# Patient Record
Sex: Female | Born: 1975
Health system: Southern US, Community
[De-identification: ages and names within clinical notes are randomized; demographics above are authoritative.]

## PROBLEM LIST (undated history)

## (undated) DIAGNOSIS — I10 Essential (primary) hypertension: Secondary | ICD-10-CM

## (undated) DIAGNOSIS — E119 Type 2 diabetes mellitus without complications: Secondary | ICD-10-CM

## (undated) DIAGNOSIS — E7800 Pure hypercholesterolemia, unspecified: Secondary | ICD-10-CM

## (undated) HISTORY — DX: Pure hypercholesterolemia, unspecified: E78.00

## (undated) HISTORY — DX: Essential (primary) hypertension: I10

## (undated) HISTORY — DX: Type 2 diabetes mellitus without complications: E11.9

---

## 1998-07-16 ENCOUNTER — Emergency Department (HOSPITAL_COMMUNITY): Admission: EM | Admit: 1998-07-16 | Discharge: 1998-07-16 | Payer: Self-pay | Admitting: Emergency Medicine

## 1999-11-04 ENCOUNTER — Other Ambulatory Visit: Admission: RE | Admit: 1999-11-04 | Discharge: 1999-11-04 | Payer: Self-pay | Admitting: Obstetrics and Gynecology

## 2000-01-26 ENCOUNTER — Emergency Department (HOSPITAL_COMMUNITY): Admission: EM | Admit: 2000-01-26 | Discharge: 2000-01-26 | Payer: Self-pay | Admitting: Emergency Medicine

## 2000-12-05 ENCOUNTER — Other Ambulatory Visit: Admission: RE | Admit: 2000-12-05 | Discharge: 2000-12-05 | Payer: Self-pay | Admitting: Obstetrics and Gynecology

## 2002-12-05 ENCOUNTER — Emergency Department (HOSPITAL_COMMUNITY): Admission: EM | Admit: 2002-12-05 | Discharge: 2002-12-05 | Payer: Self-pay | Admitting: Emergency Medicine

## 2003-09-20 ENCOUNTER — Emergency Department (HOSPITAL_COMMUNITY): Admission: EM | Admit: 2003-09-20 | Discharge: 2003-09-21 | Payer: Self-pay | Admitting: Emergency Medicine

## 2003-09-23 ENCOUNTER — Ambulatory Visit (HOSPITAL_COMMUNITY): Admission: RE | Admit: 2003-09-23 | Discharge: 2003-09-23 | Payer: Self-pay

## 2003-10-03 ENCOUNTER — Ambulatory Visit (HOSPITAL_COMMUNITY): Admission: RE | Admit: 2003-10-03 | Discharge: 2003-10-03 | Payer: Self-pay | Admitting: Neurology

## 2003-12-22 ENCOUNTER — Emergency Department (HOSPITAL_COMMUNITY): Admission: EM | Admit: 2003-12-22 | Discharge: 2003-12-22 | Payer: Self-pay | Admitting: Emergency Medicine

## 2004-03-22 ENCOUNTER — Ambulatory Visit (HOSPITAL_COMMUNITY): Admission: RE | Admit: 2004-03-22 | Discharge: 2004-03-22 | Payer: Self-pay | Admitting: Neurology

## 2004-06-16 ENCOUNTER — Encounter: Admission: RE | Admit: 2004-06-16 | Discharge: 2004-06-16 | Payer: Self-pay | Admitting: Neurology

## 2008-02-27 ENCOUNTER — Encounter: Admission: RE | Admit: 2008-02-27 | Discharge: 2008-02-27 | Payer: Self-pay | Admitting: Neurology

## 2016-01-13 ENCOUNTER — Other Ambulatory Visit: Payer: Self-pay | Admitting: Physician Assistant

## 2016-01-13 DIAGNOSIS — Z1231 Encounter for screening mammogram for malignant neoplasm of breast: Secondary | ICD-10-CM

## 2018-11-13 ENCOUNTER — Encounter: Payer: Self-pay | Admitting: Neurology

## 2018-12-05 NOTE — Progress Notes (Signed)
NEUROLOGY CONSULTATION NOTE  Melissa Collins MRN: [id number] DOB: 1975-02-22  Referring provider: Ralene Ok, PA-C Primary care provider: Ralene Ok, PA-C  Reason for consult:  Multiple sclerosis  HISTORY OF PRESENT ILLNESS: Melissa Collins is a 43 year old right-handed black female with hypertension who presents for multiple sclerosis.  History supplemented by referring provider note.  CT head from 09/21/2003 and MRIs of brain and cervical spine below are personally reviewed.  She was diagnosed with multiple sclerosis in 2005 after presenting with right hand and left foot numbness.  On 09/21/2003, she had a CT head without contrast that showed two white matter lesions in the right frontal lobe.  Follow up CT head with contrast demonstrated enhancement of one of the lesions.  She had an MRI of the brain and cervical spine with and without contrast on 09/24/03 which showed multiple subcortical white matter lesions in the cerebral hemispheres ranging from few millimeters up to 2 cm in diameter, several with abnormal enhancement.  No involvement of the brainstem, cerebellum and cervical spinal cord.  She did not have a lumbar puncture.  She was started on Betaseron from 2005 until early 2009.  It was discontinued due to fatigue and flu-like symptoms.  She has not been on any subsequent DMT.  She reports no significant MS flares.  Every now and then, she may feel lightheaded or she may note tingling in the leg or foot.  Vision:  In 2019 to early 2020, she had 3 episodes where she woke up in the morning and had complete vision loss for a couple of minutes in the right eye.  It happened three times about 3 to 4 months apart.  She didn't follow up with the eye doctor. No ocular pain.   Motor:  none Sensory:  none Pain:  none Gait:  none Bowel/Bladder:  none Fatigue:  none Cognition:  Good. Mood:  Good.  Current disease modifying therapy:  None Prior disease modifying therapy:  Betaseron (2005-2009;  flu-like symptoms, fatigue)  Imaging: 09/21/03 CT with and without contrast:  2 white matter lesions in right frontal lobe, one measuring 1 cm with enhancement. 09/24/03 MRI of brain and cervical spine with and without contrast: Multiple subcortical white matter lesions in the cerebral hemispheres ranging from few millimeters up to 2 cm in diameter, several with abnormal enhancement.  No involvement of the brainstem, cerebellum and cervical spinal cord. 03/23/2004 MRI of brain and cervical spine with and without contrast: 2 or 3 new lesions however previously noted lesions from 10/03/2003 have decreased in signal intensity and size plus less contrast enhancement. 06/17/04 MRI of brain with and without contrast: Multiple subcortical white matter lesions in the cerebral hemisphere, stable compared to prior imaging from 03/23/2004 as well as right frontal parietal developmental venous anomaly. 02/28/08 MRI of brain and cervical spine with and without contrast: Multiple subcortical white matter lesions in the bilateral cerebral hemispheres, improved from prior study on 06/17/2004.  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  No spinal cord involvement.  No family history of MS.  PAST MEDICAL HISTORY: Hypertension  PAST SURGICAL HISTORY: History reviewed. No pertinent surgical history.   MEDICATIONS: Outpatient Encounter Medications as of 12/08/2018  Medication Sig  . amLODipine (NORVASC) 5 MG tablet Take by mouth.  Marland Kitchen atorvastatin (LIPITOR) 20 MG tablet TAKE 1 TABLET BY MOUTH EVERY DAY   No facility-administered encounter medications on file as of 12/08/2018.     ALLERGIES: No Known Allergies  FAMILY HISTORY:  Family History  Problem Relation Age of Onset  . Diabetes Mother   . Hypertension Mother   . Diabetes Father   . Healthy Brother     SOCIAL HISTORY: Social History   Tobacco Use  . Smoking status: Never Smoker  . Smokeless tobacco: Never Used  Substance  Use Topics  . Alcohol use: Yes  . Drug use: Never    REVIEW OF SYSTEMS: Constitutional: No fevers, chills, or sweats, no generalized fatigue, change in appetite Eyes: No visual changes, double vision, eye pain Ear, nose and throat: No hearing loss, ear pain, nasal congestion, sore throat Cardiovascular: No chest pain, palpitations Respiratory:  No shortness of breath at rest or with exertion, wheezes GastrointestinaI: No nausea, vomiting, diarrhea, abdominal pain, fecal incontinence Genitourinary:  No dysuria, urinary retention or frequency Musculoskeletal:  No neck pain, back pain Integumentary: No rash, pruritus, skin lesions Neurological: as above Psychiatric: No depression, insomnia, anxiety Endocrine: No palpitations, fatigue, diaphoresis, mood swings, change in appetite, change in weight, increased thirst Hematologic/Lymphatic:  No purpura, petechiae. Allergic/Immunologic: no itchy/runny eyes, nasal congestion, recent allergic reactions, rashes  PHYSICAL EXAM: Blood pressure (!) 157/97, pulse 73, height 5\' 4"  (1.626 m), weight 268 lb 6.4 oz (121.7 kg), SpO2 100 %. General: No acute distress.  Patient appears well-groomed.   Head:  Normocephalic/atraumatic Eyes:  fundi examined but not visualized Neck: supple, no paraspinal tenderness, full range of motion Back: No paraspinal tenderness Heart: regular rate and rhythm Lungs: Clear to auscultation bilaterally. Vascular: No carotid bruits. Neurological Exam: Mental status: alert and oriented to person, place, and time, recent and remote memory intact, fund of knowledge intact, attention and concentration intact, speech fluent and not dysarthric, language intact. Cranial nerves: CN I: not tested CN II: pupils equal, round and reactive to light, visual fields intact CN III, IV, VI:  full range of motion, no nystagmus, no ptosis CN V: facial sensation intact CN VII: upper and lower face symmetric CN VIII: hearing intact CN IX,  X: gag intact, uvula midline CN XI: sternocleidomastoid and trapezius muscles intact CN XII: tongue midline Bulk & Tone: normal, no fasciculations. Motor:  5/5 throughout  Sensation:  Pinprick and vibration sensation intact.   Deep Tendon Reflexes:  2+ throughout, toes downgoing.   Finger to nose testing:  Without dysmetria.   Heel to shin:  Without dysmetria.   Gait:  Normal station and stride.  Able to turn and tandem walk. Romberg negative.  IMPRESSION: Multiple sclerosis Questionable amaurosis fugax in right eye.  Unclear if MS-related vs vascular.  PLAN: 1.  MRI of brain w/wo and MRA of head and neck 2.  Check CBC with diff, CMP and vitamin D 3.  Defers DMT for now.  If MRI shows disease progression, will have her follow up to start DMT.  Otherwise, follow up in one year. 4.  Will likely recommend starting ASA 81mg  daily. 5.  Otherwise, she will follow up in one year.  Thank you for allowing me to take part in the care of this patient.  Metta Clines, DO  CC: Junie Spencer, PA-C

## 2018-12-08 ENCOUNTER — Encounter: Payer: Self-pay | Admitting: Neurology

## 2018-12-08 ENCOUNTER — Other Ambulatory Visit: Payer: Self-pay

## 2018-12-08 ENCOUNTER — Ambulatory Visit (INDEPENDENT_AMBULATORY_CARE_PROVIDER_SITE_OTHER): Payer: 59 | Admitting: Neurology

## 2018-12-08 ENCOUNTER — Other Ambulatory Visit (INDEPENDENT_AMBULATORY_CARE_PROVIDER_SITE_OTHER): Payer: 59

## 2018-12-08 VITALS — BP 157/97 | HR 73 | Ht 64.0 in | Wt 268.4 lb

## 2018-12-08 DIAGNOSIS — G35 Multiple sclerosis: Secondary | ICD-10-CM | POA: Diagnosis not present

## 2018-12-08 DIAGNOSIS — G453 Amaurosis fugax: Secondary | ICD-10-CM

## 2018-12-08 DIAGNOSIS — G35D Multiple sclerosis, unspecified: Secondary | ICD-10-CM

## 2018-12-08 LAB — VITAMIN D 25 HYDROXY (VIT D DEFICIENCY, FRACTURES): VITD: 17.03 ng/mL — ABNORMAL LOW (ref 30.00–100.00)

## 2018-12-08 NOTE — Patient Instructions (Addendum)
1.  Check MRI of brain with and without contrast and MRA of head and neck 2.  Check CBC with diff, CMP, and vitamin D 3.   Follow up in one year but if MRI findings require change in management, I will have you make a follow up virtual appointment.  A referral to [facility] has been placed for your MRI someone will contact you directly to schedule your appt. They are located at [place]. Please contact them directly by calling [phone] with any questions regarding your referral.  Your provider has requested that you have labwork completed today. Please go to [facility] ([id number]) on the second floor of this building before leaving the office today. You do not need to check in. If you are not called within 15 minutes please check with the front desk.

## 2018-12-09 LAB — CBC WITH DIFFERENTIAL
Basophils Absolute: 0.1 10*3/uL (ref 0.0–0.2)
Basos: 1 %
EOS (ABSOLUTE): 0.2 10*3/uL (ref 0.0–0.4)
Eos: 3 %
Hematocrit: 39 % (ref 34.0–46.6)
Hemoglobin: 12.7 g/dL (ref 11.1–15.9)
Immature Grans (Abs): 0 10*3/uL (ref 0.0–0.1)
Immature Granulocytes: 0 %
Lymphocytes Absolute: 2 10*3/uL (ref 0.7–3.1)
Lymphs: 28 %
MCH: 26.6 pg (ref 26.6–33.0)
MCHC: 32.6 g/dL (ref 31.5–35.7)
MCV: 82 fL (ref 79–97)
Monocytes Absolute: 0.6 10*3/uL (ref 0.1–0.9)
Monocytes: 9 %
Neutrophils Absolute: 4.3 10*3/uL (ref 1.4–7.0)
Neutrophils: 59 %
RBC: 4.78 x10E6/uL (ref 3.77–5.28)
RDW: 14.3 % (ref 11.7–15.4)
WBC: 7.2 10*3/uL (ref 3.4–10.8)

## 2018-12-09 LAB — COMPLETE METABOLIC PANEL WITH GFR
AG Ratio: 1.2 (calc) (ref 1.0–2.5)
ALT: 14 U/L (ref 6–29)
AST: 13 U/L (ref 10–30)
Albumin: 4.2 g/dL (ref 3.6–5.1)
Alkaline phosphatase (APISO): 86 U/L (ref 31–125)
BUN: 14 mg/dL (ref 7–25)
CO2: 25 mmol/L (ref 20–32)
Calcium: 9.7 mg/dL (ref 8.6–10.2)
Chloride: 100 mmol/L (ref 98–110)
Creat: 0.84 mg/dL (ref 0.50–1.10)
GFR, Est African American: 100 mL/min/{1.73_m2} (ref >=60)
GFR, Est Non African American: 86 mL/min/{1.73_m2} (ref >=60)
Globulin: 3.4 g/dL (calc) (ref 1.9–3.7)
Glucose, Bld: 240 mg/dL — ABNORMAL HIGH (ref 65–99)
Potassium: 3.9 mmol/L (ref 3.5–5.3)
Sodium: 135 mmol/L (ref 135–146)
Total Bilirubin: 0.6 mg/dL (ref 0.2–1.2)
Total Protein: 7.6 g/dL (ref 6.1–8.1)

## 2019-05-01 ENCOUNTER — Telehealth: Payer: Self-pay | Admitting: Neurology

## 2019-05-01 NOTE — Telephone Encounter (Signed)
Pt states that about two weeks ago her tongue started going numb on th rt side. She states that her face on the rt side is numb and it is getting worse please call  He does not have any appts until July

## 2019-05-01 NOTE — Telephone Encounter (Signed)
Suggestions please? 

## 2019-05-01 NOTE — Telephone Encounter (Signed)
We can set her up for Solu-Medrol 1000mg  daily for 3 days.  [facility] is booking out a couple of weeks.  I would like to contact [facility] to see if they can set her up for an infusion in their center. Back in October, I had ordered MRI of brain with and without contrast as well as MRA of head and neck.  It doesn't seem to have been performed.  I would still like to have this performed (definitely the MRI of the brain ASAP). In the meantime, we can schedule her for 7:50 AM on Friday

## 2019-05-03 ENCOUNTER — Other Ambulatory Visit: Payer: Self-pay

## 2019-05-03 DIAGNOSIS — G453 Amaurosis fugax: Secondary | ICD-10-CM

## 2019-05-03 DIAGNOSIS — G35D Multiple sclerosis, unspecified: Secondary | ICD-10-CM

## 2019-05-03 DIAGNOSIS — G35 Multiple sclerosis: Secondary | ICD-10-CM

## 2019-05-03 NOTE — Telephone Encounter (Signed)
Can you schedule patient for the am and sent link back to me, thanks

## 2019-05-03 NOTE — Telephone Encounter (Signed)
I notified patient of time for 750 tomorrow, Mri is ordered, patient did not go to the previous appts. Hold off on infusion per Dr. Everlena Cooper for assessement 05/04/2019.

## 2019-05-03 NOTE — Progress Notes (Signed)
NEUROLOGY FOLLOW UP OFFICE NOTE  [name] [id number]  HISTORY OF PRESENT ILLNESS: Melissa Collins is a 44 year old right-handed black female with hypertension who follows up for multiple sclerosis.    UPDATE: On initial consult in October, I ordered MRI of brain with and without contrast to establish new baseline and evaluate for any disease progression since 2010.  If there was progression, decision was to start DMT.  I also ordered MRA of head and neck due to reported episodes of vision loss.  It appears that the MRI of brain was never ordered.  MRA of head and neck was ordered but never scheduled.   2 weeks ago, her tongue became numb and over the course of the next week gradually progressed to include the entire right side of her face and head with perioral tingling as well as tingling of her tongue and right side of her chin.  She cannot taste on the right side of her tongue.  No involvement of the neck, extremities or torso.  No weakness, dizziness, balance disorder or visual changes.  While she has had episodes of numbness in the past, nothing this severe.   It hasn't improved.  Current disease modifying therapy:  None  12/08/2018 LABS:  CBC with WBC 7.2, HGB 12.7, HCT 39, ALC 2.0; CMP with Na 135, K 3.9, Cl 100, CO2 25, Ca 9.7, glucose 240, BUN 14, Cr 0.84, t bili 0.6, ALP 86, AST 13, ALT 14; D 25-hydroxy 17.03.  She was advised to start D3 4000 IU daily.  HISTORY: She was diagnosed with multiple sclerosis in 2005 after presenting with right hand and left foot numbness.  On 09/21/2003, she had a CT head without contrast that showed two white matter lesions in the right frontal lobe.  Follow up CT head with contrast demonstrated enhancement of one of the lesions.  She had an MRI of the brain and cervical spine with and without contrast on 09/24/03 which showed multiple subcortical white matter lesions in the cerebral hemispheres ranging from few millimeters up to 2 cm in diameter, several with  abnormal enhancement.  No involvement of the brainstem, cerebellum and cervical spinal cord.  She did not have a lumbar puncture.  She was started on Betaseron from 2005 until early 2009.  It was discontinued due to fatigue and flu-like symptoms.  She has not been on any subsequent DMT.  She reports no significant MS flares.  Every now and then, she may feel lightheaded or she may note tingling in the leg or foot.  In 2019 to early 2020, she had 3 episodes where she woke up in the morning and had complete vision loss for a couple of minutes in the right eye.  It happened three times about 3 to 4 months apart.  She didn't follow up with the eye doctor. No ocular pain.     Prior disease modifying therapy:  Betaseron (2005-2009; flu-like symptoms, fatigue)  Imaging: 09/21/03 CT with and without contrast:  2 white matter lesions in right frontal lobe, one measuring 1 cm with enhancement. 09/24/03 MRI of brain and cervical spine with and without contrast: Multiple subcortical white matter lesions in the cerebral hemispheres ranging from few millimeters up to 2 cm in diameter, several with abnormal enhancement.  No involvement of the brainstem, cerebellum and cervical spinal cord. 03/23/2004 MRI of brain and cervical spine with and without contrast: 2 or 3 new lesions however previously noted lesions from 10/03/2003 have decreased in signal  intensity and size plus less contrast enhancement. 06/17/04 MRI of brain with and without contrast: Multiple subcortical white matter lesions in the cerebral hemisphere, stable compared to prior imaging from 03/23/2004 as well as right frontal parietal developmental venous anomaly. 02/28/08 MRI of brain and cervical spine with and without contrast: Multiple subcortical white matter lesions in the bilateral cerebral hemispheres, improved from prior study on 06/17/2004.  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  No spinal cord involvement.  No family  history of MS.  PAST MEDICAL HISTORY: Past Medical History:  Diagnosis Date  . High cholesterol   . Hypertension     MEDICATIONS: Current Outpatient Medications on File Prior to Visit  Medication Sig Dispense Refill  . amLODipine (NORVASC) 5 MG tablet Take by mouth.    Marland Kitchen atorvastatin (LIPITOR) 20 MG tablet TAKE 1 TABLET BY MOUTH EVERY DAY     No current facility-administered medications on file prior to visit.    ALLERGIES: No Known Allergies  FAMILY HISTORY: Family History  Problem Relation Age of Onset  . Diabetes Mother   . Hypertension Mother   . Diabetes Father   . Healthy Brother    SOCIAL HISTORY: Social History   Socioeconomic History  . Marital status: Single    Spouse name: Not on file  . Number of children: 1  . Years of education: Not on file  . Highest education level: Some college, no degree  Occupational History  . Not on file  Tobacco Use  . Smoking status: Never Smoker  . Smokeless tobacco: Never Used  Substance and Sexual Activity  . Alcohol use: Yes  . Drug use: Never  . Sexual activity: Not on file  Other Topics Concern  . Not on file  Social History Narrative  . Not on file   Social Determinants of Health   Financial Resource Strain:   . Difficulty of Paying Living Expenses:   Food Insecurity:   . Worried About Programme researcher, broadcasting/film/video in the Last Year:   . Barista in the Last Year:   Transportation Needs:   . Freight forwarder (Medical):   Marland Kitchen Lack of Transportation (Non-Medical):   Physical Activity:   . Days of Exercise per Week:   . Minutes of Exercise per Session:   Stress:   . Feeling of Stress :   Social Connections:   . Frequency of Communication with Friends and Family:   . Frequency of Social Gatherings with Friends and Family:   . Attends Religious Services:   . Active Member of Clubs or Organizations:   . Attends Banker Meetings:   Marland Kitchen Marital Status:   Intimate Partner Violence:   . Fear of  Current or Ex-Partner:   . Emotionally Abused:   Marland Kitchen Physically Abused:   . Sexually Abused:      PHYSICAL EXAM: Blood pressure (!) 139/95, pulse 86, resp. rate 18, height 5\' 4"  (1.626 m), weight 265 lb (120.2 kg), SpO2 98 %. General: No acute distress.  Patient appears well-groomed.   Head:  Normocephalic/atraumatic Eyes:  Fundi examined but not visualized Neck: supple, no paraspinal tenderness, full range of motion Heart:  Regular rate and rhythm Lungs:  Clear to auscultation bilaterally Back: No paraspinal tenderness Neurological Exam: alert and oriented to person, place, and time. Attention span and concentration intact, recent and remote memory intact, fund of knowledge intact.  Speech fluent and not dysarthric, language intact.  Decreased right V1-V3 sensory loss.  Otherwise, CN II-XII intact. Bulk and tone normal, muscle strength 5/5 throughout.  Sensation to pinprick and vibration intact.  Deep tendon reflexes 2+ throughout, toes downgoing.  Finger to nose and heel to shin testing intact.  Gait normal, able to turn and tandem walk.  Romberg negative.  IMPRESSION: 1.  Multiple sclerosis with exacerbation.  Typically I wouldn't treat a flare up for sensory deficits especially if ongoing for more than 2 weeks.  However, since she says symptoms have steadily gotten worse, it is reasonable.  However, I would like to avoid steroids due to diabetes.  Instead, we will try H.P. Acthar Gel.  She is now agreeable to starting a DMT. 2.  Episodic vision loss in right eye.  No recent events.  May be related to [place] but would like to evaluate for possible carotid artery stenosis  PLAN: 1.  We will order MRI of brain and cervical spine with and without contrast 2.  Due to symptoms of amaurosis fugax, will check bilateral carotid doppler 3.  For MS flare-up, H.P. Acthar Gel 80mg  daily for 2 weeks 4.  For DMT, start Vumerity 5.  D3 4000 IU daily 5.  Repeat CBC with diff, hepatic panel and vitamin D  level in 6 months (about a week prior to follow up)  Metta Clines, DO  CC: Junie Spencer, PA-C

## 2019-05-03 NOTE — Telephone Encounter (Signed)
Pt is already on the schedule for 05-04-19 @ 7:50

## 2019-05-04 ENCOUNTER — Ambulatory Visit: Payer: 59 | Admitting: Neurology

## 2019-05-04 ENCOUNTER — Encounter: Payer: Self-pay | Admitting: Neurology

## 2019-05-04 ENCOUNTER — Other Ambulatory Visit: Payer: Self-pay

## 2019-05-04 VITALS — BP 139/95 | HR 86 | Resp 18 | Ht 64.0 in | Wt 265.0 lb

## 2019-05-04 DIAGNOSIS — G35 Multiple sclerosis: Secondary | ICD-10-CM | POA: Diagnosis not present

## 2019-05-04 DIAGNOSIS — E119 Type 2 diabetes mellitus without complications: Secondary | ICD-10-CM | POA: Diagnosis not present

## 2019-05-04 DIAGNOSIS — G35D Multiple sclerosis, unspecified: Secondary | ICD-10-CM

## 2019-05-04 DIAGNOSIS — G453 Amaurosis fugax: Secondary | ICD-10-CM

## 2019-05-04 NOTE — Patient Instructions (Signed)
1.  We will get MRI of brain and cervical spine with and without contrast 2.  We will check carotid doppler 3.  For this MS flare up, I want to prescribe you an injection called H.P. Acthar Gel.   1 injection daily for 14 days. 4.  I want to start a daily MS medication called Vumerity.  We start 231mg  twice daily for 2 weeks, then increase to 462mg  twice daily 5.  Start vitamin D3 4000 IU daily 6.  Repeat CBC with diff, hepatic panel and vitamin D level in 6 months (about a week prior to follow up)

## 2019-05-08 ENCOUNTER — Other Ambulatory Visit: Payer: Self-pay | Admitting: Neurology

## 2019-05-08 ENCOUNTER — Telehealth: Payer: Self-pay | Admitting: Neurology

## 2019-05-08 MED ORDER — ACTHAR 80 UNIT/ML IJ GEL: 80.0000 [IU] | Freq: Every day | INTRAMUSCULAR | 0 refills | Status: DC

## 2019-05-08 MED ORDER — D3-1000 25 MCG (1000 UT) PO CAPS: 4000.0000 [IU] | ORAL_CAPSULE | Freq: Every day | ORAL | 5 refills | Status: DC

## 2019-05-08 NOTE — Progress Notes (Signed)
Sent script to [facility] for Acthar gel

## 2019-05-08 NOTE — Telephone Encounter (Signed)
I sent a script for the Acthar gel to [facility] as well, but may need some kind of prior authorization.

## 2019-05-08 NOTE — Telephone Encounter (Signed)
Patient called back to add on that her pharmacy does not have her vitamin D prescription either.

## 2019-05-08 NOTE — Telephone Encounter (Signed)
I [facility] the [facility]  The Vumerity and Acthar gel are specialty pharmacies.  The form for Vumerity was filled out last week.  We were going to contact the rep for Acthar gel to find out how to get this filled.

## 2019-05-08 NOTE — Telephone Encounter (Signed)
Patient called and requested a call back to confirm her medications. She said she thought she was to start two new medications for MS but the pharmacy does not have them.  1. "Vumerity"  2. "Acthar gel"  [facility] [address]

## 2019-05-08 NOTE — Telephone Encounter (Signed)
Pt called informed that [facility] and vit D3 and the Vumerity will come from specialty pharmacy ,

## 2019-05-09 ENCOUNTER — Encounter: Payer: Self-pay | Admitting: *Deleted

## 2019-05-09 NOTE — Progress Notes (Addendum)
While [employer] assists with medication procurement/payment, etc.  prior authorization from [employer] company is still required even if denied it must be documented that it is denied. Some insurance companies will require appeals to the denial before [employer] is allowed to help.   [facility] (Key: [id number]) Rx #: [id number] Vumerity 231MG  dr capsules   Form [facility] Electronic Prior Authorization Form (2017 NCPDP) Created 4 hours ago Sent to Plan 21 minutes ago Plan Response 21 minutes ago Submit Clinical Questions 5 minutes ago Determination Unfavorable less than a minute ago Message from Plan Request Reference Number: 12-05-2004. VUMERITY CAP 231MG  is denied for not meeting the prior authorization requirement(s). Details of this decision are in the notice attached below or have been faxed to you. Appeals are not supported through ePA. Please refer to the fax case notice for appeals information and instructions.

## 2019-05-09 NOTE — Progress Notes (Signed)
Fax received from [facility] sent to scan into her chart. 05/09/2019 Shon Millet [address] [place], [place] [address] Plan member ID: [id number] Case number: [id number] Prescriber name: Shon Millet Prescriber fax: [phone] NOTICE OF DENIAL Dear Arbutus Leas, On behalf of [employer], [facility] is responsible for reviewing pharmacy services provided to [facility] members. We received a request from your prescriber for coverage of Vumerity Cap 231mg . We reviewed all of the information you and/or your doctor sent to and sent the information to an appropriate physician specialist if needed. Unfortunately, we must deny coverage for Vumerity. Why was my request denied? This request was denied because you did not meet the following clinical requirements: The requested medication and/or diagnosis are not a covered benefit and excluded from coverage in accordance with the terms and conditions of your plan benefit. Therefore, the request has been administratively denied. The requested medication and/or diagnosis are not a covered benefit and are excluded from coverage in accordance with the terms and conditions of your plan benefit. Therefore, this request has been administratively denied. The reason(s) [facility] did not approve this medication can be found above. This denial is based on our Vumerity drug coverage policy, in addition to any supplementary information you or your prescriber may have submitted. How can I obtain the material(s) used to review this request? You may request, free of charge, a copy of the drug coverage policy, actual benefit provision, guideline, protocol or other information that factored into the decision, including the diagnosis code and the treatment code and their corresponding meanings, by calling us at [phone], or by writing to the address below: [facility] c/o Prior Authorization Guidelines [address] [place], [place] Please  note that this decision only affects whether your prescription plan will pay for this medication. Only you and your prescriber can decide what is best for you and your treatment. You may still buy this medication (at full cost) at your local pharmacy

## 2019-05-10 ENCOUNTER — Other Ambulatory Visit: Payer: Self-pay

## 2019-05-10 MED ORDER — ACTHAR 80 UNIT/ML IJ GEL: 80.0000 [IU] | Freq: Every day | INTRAMUSCULAR | 0 refills | Status: AC

## 2019-05-11 ENCOUNTER — Other Ambulatory Visit: Payer: Self-pay

## 2019-05-11 NOTE — Progress Notes (Signed)
Acthar Gel starter form faxed to [facility] at [phone], after faxing form sent to scan,

## 2019-05-14 DIAGNOSIS — Z0279 Encounter for issue of other medical certificate: Secondary | ICD-10-CM

## 2019-05-16 NOTE — Progress Notes (Signed)
PA started in [facility] my meds  Key [id number] CASE ID - [id number] Waiting on determination

## 2019-05-18 ENCOUNTER — Ambulatory Visit
Admission: RE | Admit: 2019-05-18 | Discharge: 2019-05-18 | Disposition: A | Discharge status: Home / Self Care | Payer: 59 | Source: Ambulatory Visit | Attending: Neurology | Admitting: Neurology

## 2019-05-18 DIAGNOSIS — G453 Amaurosis fugax: Secondary | ICD-10-CM

## 2019-05-18 DIAGNOSIS — G35 Multiple sclerosis: Secondary | ICD-10-CM

## 2019-05-21 ENCOUNTER — Telehealth: Payer: Self-pay

## 2019-05-21 ENCOUNTER — Other Ambulatory Visit: Payer: Self-pay

## 2019-05-21 DIAGNOSIS — G35 Multiple sclerosis: Secondary | ICD-10-CM

## 2019-05-21 DIAGNOSIS — G453 Amaurosis fugax: Secondary | ICD-10-CM

## 2019-05-21 NOTE — Progress Notes (Signed)
Pt informed of her CT results as well as Dr. Everlena Cooper adding CTA head and neck for GI [place].

## 2019-05-21 NOTE — Telephone Encounter (Signed)
-----   Message from Drema Dallas, DO sent at 05/20/2019  5:12 PM EDT ----- Carotid ultrasound shows no obvious findings but there is subtle findings suggesting possible blood vessel narrowing.  For better imaging, I would like to get CTA of head and neck for amaurosis fugax.

## 2019-05-21 NOTE — Telephone Encounter (Signed)
Tried calling pt, No answer Voice mail full. Will add order for CTA Head and neck per Dr. Everlena Cooper.

## 2019-05-22 ENCOUNTER — Telehealth: Payer: Self-pay | Admitting: Neurology

## 2019-05-22 NOTE — Telephone Encounter (Signed)
Yes, plan is to appeal the PA.  Patient is on the [employer] and should have already started Vumerity.

## 2019-05-22 NOTE — Telephone Encounter (Signed)
Patient's pharmacy called and wanted to know if Dr. Everlena Cooper is going to appeal the PA on Vamerity.

## 2019-05-23 NOTE — Telephone Encounter (Signed)
Called [employer] they confirmed medication was delivered on 05/16/2019. I confirmed we are working on appeal of denial received 05/09/2019  [id number]

## 2019-05-24 ENCOUNTER — Encounter: Payer: Self-pay | Admitting: Neurology

## 2019-05-24 ENCOUNTER — Encounter: Payer: Self-pay | Admitting: *Deleted

## 2019-05-24 NOTE — Progress Notes (Signed)
Fax sent to the plan Your PA has been faxed to the plan as a paper copy. Please contact the plan directly if you haven't received a determination in a typical timeframe.  You will be notified of the determination electronically and via fax. [place] (Key: [id number]) Vumerity 231MG  dr capsules   Form Form Appeal Created 15 days ago Sent to Plan 2 minutes ago Determination Wait for Determination Please wait for the payer to return a determination.  This form is used to appeal a previously denied prior authorization request.

## 2019-05-29 ENCOUNTER — Telehealth: Payer: Self-pay | Admitting: Neurology

## 2019-05-29 NOTE — Telephone Encounter (Signed)
Pt c/o numbness in her left arm x week and legs x 2 days. The numbness can radiate down her arm and legs. No pain in her legs or feet. Slight headache.    Pt also inquring about her appeal letter for Acthar. And discuss the MRI results.

## 2019-05-29 NOTE — Telephone Encounter (Signed)
Unfortunately there isn't any medication to treat numbness.  If she endorses paresthesias, such as tingling and pins and needles sensation, then we can prescribe gabapentin 100mg  three times daily.

## 2019-05-29 NOTE — Progress Notes (Addendum)
  Telephone call to pt insurance, Appeal in process have until 5/5 with determination.   Request Reference Number: [id number]. ACTHAR INJ [id number] is denied for not meeting the prior authorization requirement(s). Details of this decision are in the notice attached below or have been faxed to you.

## 2019-05-29 NOTE — Telephone Encounter (Signed)
Patient is calling about the paper work for the medication that we were working on. She would like the status of the paperwork and also she would like to speak to someone about her left leg and arm. Going numb   She works from 10:30 to 7:00

## 2019-05-29 NOTE — Progress Notes (Signed)
Acthar Gel Denied in cover mymeds, Dr Everlena Cooper given the appeal paper work

## 2019-05-29 NOTE — Telephone Encounter (Signed)
LMOVM for pt to call back  to discuss Dr. Everlena Cooper note

## 2019-05-31 ENCOUNTER — Telehealth: Payer: Self-pay | Admitting: Neurology

## 2019-05-31 NOTE — Telephone Encounter (Signed)
1. Patient called and left a message stating she wants to move forward with the new medication Dr. Everlena Cooper recommended for the tingling in her face and tongue.  2. Patient wants an update on her FMLA forms.

## 2019-06-01 NOTE — Telephone Encounter (Signed)
Telephone call to pt, Advised pt that the I left stated if pt is having any tingling in her feet and legs then we will send in Gabapentin. Also advised pt that we discussed on her last call pt has to do the Labs and MRI Dr. Adriana Mccallum ordered before he can fill out her FMLA forms.   Pt verbalized understanding.

## 2019-06-05 ENCOUNTER — Telehealth: Payer: Self-pay | Admitting: Neurology

## 2019-06-05 NOTE — Telephone Encounter (Signed)
LMOVM to call us back. Appeal already started for the medication

## 2019-06-05 NOTE — Progress Notes (Signed)
Received letter from [facility] dated May 25, 2019.  It has been determined that the medication is not eligible for benefits as requested. Full Letter sent to scan into her chart and faxed to Crystal with [employer]  Fax#[phone]

## 2019-06-05 NOTE — Telephone Encounter (Signed)
The following message was left with [employer] on 06/05/19 at 12:25 PM.  French Ana from [facility] wants to know if an appeal can be submitted for a medication that was declined by PA (Vumerity).  Pharmacy requesting a call back to discuss a possible appeal of patient's behalf.

## 2019-06-06 ENCOUNTER — Ambulatory Visit
Admission: RE | Admit: 2019-06-06 | Discharge: 2019-06-06 | Disposition: A | Discharge status: Home / Self Care | Payer: 59 | Source: Ambulatory Visit | Attending: Neurology | Admitting: Neurology

## 2019-06-06 ENCOUNTER — Other Ambulatory Visit: Payer: Self-pay

## 2019-06-06 DIAGNOSIS — G35D Multiple sclerosis, unspecified: Secondary | ICD-10-CM

## 2019-06-06 DIAGNOSIS — G453 Amaurosis fugax: Secondary | ICD-10-CM

## 2019-06-06 DIAGNOSIS — G35 Multiple sclerosis: Secondary | ICD-10-CM

## 2019-06-06 MED ORDER — IOHEXOL 300 MG/ML  SOLN
100.0000 mL | Freq: Once | INTRAMUSCULAR | Status: AC | PRN
  Administered 2019-06-06: 100 mL via INTRAVENOUS

## 2019-06-11 ENCOUNTER — Other Ambulatory Visit: Payer: Self-pay | Admitting: Neurology

## 2019-06-11 NOTE — Telephone Encounter (Signed)
Pharmacy notified meds pending

## 2019-06-11 NOTE — Telephone Encounter (Signed)
Left message with the after hour service on 06-08-19 @ 5:46 pm   Caller states she is a Child psychotherapist specialty pharmacy regardign a RX for patient please call back on Monday

## 2019-06-12 ENCOUNTER — Ambulatory Visit
Admission: RE | Admit: 2019-06-12 | Discharge: 2019-06-12 | Disposition: A | Discharge status: Home / Self Care | Payer: 59 | Source: Ambulatory Visit | Attending: Neurology | Admitting: Neurology

## 2019-06-12 DIAGNOSIS — G453 Amaurosis fugax: Secondary | ICD-10-CM

## 2019-06-12 DIAGNOSIS — G35 Multiple sclerosis: Secondary | ICD-10-CM

## 2019-06-12 MED ORDER — GADOBENATE DIMEGLUMINE 529 MG/ML IV SOLN
20.0000 mL | Freq: Once | INTRAVENOUS | Status: AC | PRN
  Administered 2019-06-12: 20 mL via INTRAVENOUS

## 2019-06-12 NOTE — Telephone Encounter (Signed)
Received our second denial notice for the last appeal we sent. This was forwarded to [employer] via fax. Crystal called and said she has all the information now to proceed with enrollment to receive Vumerity through their patient services. She will be reaching out to [facility] to set up shipment.

## 2019-06-13 ENCOUNTER — Other Ambulatory Visit: Payer: Self-pay

## 2019-06-13 DIAGNOSIS — G35 Multiple sclerosis: Secondary | ICD-10-CM

## 2019-06-13 NOTE — Progress Notes (Addendum)
Spoke to pt, Pt started Vumerity  2 weeks ago. Solu- Medrol 1000mg  x 3 days scheduled at [facility] 5/4-5/6.      LMOVM to call back as well as a detailed message of results of MRI and the next steps.

## 2019-06-13 NOTE — Progress Notes (Signed)
Orders for Labs added.   Melissa Collins or starla can you call and schedule this for the patient?   Per Darrol Jump calling pt 06/14/19. No answer. LMOVM

## 2019-06-14 ENCOUNTER — Telehealth: Payer: Self-pay

## 2019-06-14 NOTE — Telephone Encounter (Signed)
[facility] called back in this morning. Tech stated the appeal was still processing and she called the plan [employer]) and they did not see that any appeal was submitted to them. The plan's phone # is [phone]

## 2019-06-14 NOTE — Telephone Encounter (Signed)
Telephone call to pt insurance, Appeal in process have until 5/5 with determination.

## 2019-06-14 NOTE — Telephone Encounter (Signed)
Telephone  call to  Pt. FMLA forms filled and copy at the front desk for pt.

## 2019-06-19 ENCOUNTER — Encounter (HOSPITAL_COMMUNITY): Payer: Self-pay

## 2019-06-19 ENCOUNTER — Encounter (HOSPITAL_COMMUNITY)
Admission: RE | Admit: 2019-06-19 | Discharge: 2019-06-19 | Disposition: A | Discharge status: Home / Self Care | Payer: 59 | Source: Ambulatory Visit | Attending: Neurology | Admitting: Neurology

## 2019-06-19 ENCOUNTER — Other Ambulatory Visit: Payer: Self-pay

## 2019-06-19 DIAGNOSIS — G35 Multiple sclerosis: Secondary | ICD-10-CM | POA: Insufficient documentation

## 2019-06-19 MED ORDER — SODIUM CHLORIDE 0.9 % IV SOLN: Freq: Once | INTRAVENOUS | Status: AC

## 2019-06-19 MED ORDER — SODIUM CHLORIDE 0.9 % IV SOLN
1000.0000 mg | Freq: Every day | INTRAVENOUS | Status: DC
  Administered 2019-06-19: 11:00:00 1000 mg via INTRAVENOUS
  Filled 2019-06-19: qty 1000

## 2019-06-20 ENCOUNTER — Encounter (HOSPITAL_COMMUNITY): Payer: Self-pay

## 2019-06-20 ENCOUNTER — Encounter (HOSPITAL_COMMUNITY)
Admission: RE | Admit: 2019-06-20 | Discharge: 2019-06-20 | Disposition: A | Discharge status: Home / Self Care | Payer: 59 | Source: Ambulatory Visit | Attending: Neurology | Admitting: Neurology

## 2019-06-20 DIAGNOSIS — G35 Multiple sclerosis: Secondary | ICD-10-CM | POA: Diagnosis not present

## 2019-06-20 MED ORDER — SODIUM CHLORIDE 0.9 % IV SOLN
1000.0000 mg | Freq: Once | INTRAVENOUS | Status: AC
  Administered 2019-06-20: 1000 mg via INTRAVENOUS
  Filled 2019-06-20: qty 8

## 2019-06-20 MED ORDER — SODIUM CHLORIDE 0.9 % IV SOLN: Freq: Once | INTRAVENOUS | Status: AC

## 2019-06-21 ENCOUNTER — Other Ambulatory Visit: Payer: Self-pay

## 2019-06-21 ENCOUNTER — Telehealth: Payer: Self-pay | Admitting: *Deleted

## 2019-06-21 ENCOUNTER — Encounter (HOSPITAL_COMMUNITY)
Admission: RE | Admit: 2019-06-21 | Discharge: 2019-06-21 | Disposition: A | Discharge status: Home / Self Care | Payer: 59 | Source: Ambulatory Visit | Attending: Neurology | Admitting: Neurology

## 2019-06-21 ENCOUNTER — Encounter (HOSPITAL_COMMUNITY): Payer: Self-pay

## 2019-06-21 DIAGNOSIS — G35 Multiple sclerosis: Secondary | ICD-10-CM | POA: Diagnosis not present

## 2019-06-21 MED ORDER — SODIUM CHLORIDE 0.9 % IV SOLN
1000.0000 mg | Freq: Once | INTRAVENOUS | Status: AC
  Administered 2019-06-21: 1000 mg via INTRAVENOUS
  Filled 2019-06-21: qty 8

## 2019-06-21 MED ORDER — SODIUM CHLORIDE 0.9 % IV SOLN: Freq: Once | INTRAVENOUS | Status: AC

## 2019-06-21 NOTE — Telephone Encounter (Signed)
Can we contact Melissa Collins ASAP to see where she stands.  She needs to be started on disease modifying therapy immediately

## 2019-06-21 NOTE — Telephone Encounter (Signed)
Melissa Collins from [employer] support services left a voicemail yesterday evening asking if we are able to contact Melissa Collins to see if she is still interested in taking Vumerity for her MS. [employer] has reached out multiple times since receiving the last denial letter 06/05/2019 and Melissa Collins has been unresponsive. [employer] Support Services would like an update as to wether or not she needs help with this medication. [employer] [phone] M-F 9 am to 6 pm

## 2019-06-21 NOTE — Telephone Encounter (Signed)
Spoke with the pt, Pt spoke to the company 06/21/19.

## 2019-06-22 ENCOUNTER — Other Ambulatory Visit: Payer: 59

## 2019-06-26 ENCOUNTER — Other Ambulatory Visit: Payer: 59

## 2019-06-29 ENCOUNTER — Other Ambulatory Visit (INDEPENDENT_AMBULATORY_CARE_PROVIDER_SITE_OTHER): Payer: 59

## 2019-06-29 ENCOUNTER — Other Ambulatory Visit: Payer: Self-pay

## 2019-06-29 DIAGNOSIS — G35 Multiple sclerosis: Secondary | ICD-10-CM | POA: Diagnosis not present

## 2019-06-29 DIAGNOSIS — G453 Amaurosis fugax: Secondary | ICD-10-CM

## 2019-06-30 LAB — HEPATIC FUNCTION PANEL
AG Ratio: 1.4 (calc) (ref 1.0–2.5)
ALT: 9 U/L (ref 6–29)
AST: 9 U/L — ABNORMAL LOW (ref 10–30)
Albumin: 3.8 g/dL (ref 3.6–5.1)
Alkaline phosphatase (APISO): 71 U/L (ref 31–125)
Bilirubin, Direct: 0.1 mg/dL (ref 0.0–0.2)
Globulin: 2.8 g/dL (calc) (ref 1.9–3.7)
Indirect Bilirubin: 0.4 mg/dL (calc) (ref 0.2–1.2)
Total Bilirubin: 0.5 mg/dL (ref 0.2–1.2)
Total Protein: 6.6 g/dL (ref 6.1–8.1)

## 2019-06-30 LAB — CBC WITH DIFFERENTIAL
Basophils Absolute: 0.1 10*3/uL (ref 0.0–0.2)
Basos: 1 %
EOS (ABSOLUTE): 0.1 10*3/uL (ref 0.0–0.4)
Eos: 1 %
Hematocrit: 41.1 % (ref 34.0–46.6)
Hemoglobin: 13.1 g/dL (ref 11.1–15.9)
Immature Grans (Abs): 0.1 10*3/uL (ref 0.0–0.1)
Immature Granulocytes: 1 %
Lymphocytes Absolute: 2.1 10*3/uL (ref 0.7–3.1)
Lymphs: 22 %
MCH: 26.4 pg — ABNORMAL LOW (ref 26.6–33.0)
MCHC: 31.9 g/dL (ref 31.5–35.7)
MCV: 83 fL (ref 79–97)
Monocytes Absolute: 0.9 10*3/uL (ref 0.1–0.9)
Monocytes: 9 %
Neutrophils Absolute: 6.5 10*3/uL (ref 1.4–7.0)
Neutrophils: 66 %
RBC: 4.97 x10E6/uL (ref 3.77–5.28)
RDW: 15.4 % (ref 11.7–15.4)
WBC: 9.7 10*3/uL (ref 3.4–10.8)

## 2019-06-30 LAB — VITAMIN D 25 HYDROXY (VIT D DEFICIENCY, FRACTURES): Vit D, 25-Hydroxy: 36 ng/mL (ref 30–100)

## 2019-07-03 ENCOUNTER — Telehealth: Payer: Self-pay

## 2019-07-03 NOTE — Telephone Encounter (Signed)
FMLA: Need to make sure dates are correct on  form.

## 2019-07-17 NOTE — Telephone Encounter (Signed)
FMLA forms faxed over 07/04/19 and today 07/17/19 per pt to [employer]

## 2019-08-01 ENCOUNTER — Telehealth: Payer: Self-pay

## 2019-08-01 NOTE — Telephone Encounter (Signed)
Telephone call from [facility]: Needs provider to call to give directions and New order for Needles and Shringe they have in stock.

## 2019-08-02 NOTE — Telephone Encounter (Signed)
Script Clarified. Per Rep they only offer the 25g  5/8 needles  now.

## 2019-08-02 NOTE — Telephone Encounter (Signed)
It is not a medication that I typically prescribe, so we will need to contact the rep to find out how to specifically put in the order and what the recommended tapering schedule would be.

## 2019-08-15 ENCOUNTER — Telehealth: Payer: Self-pay

## 2019-08-15 NOTE — Telephone Encounter (Signed)
TP called in, Pt wanted to know if DR. Jaffe could change her occurrence of episodes from 1 times a month to 3 times a month with it lasting 3-5 days?   Please pt a call back to let her know if it could be changed. So she could send a new form.

## 2019-08-16 NOTE — Telephone Encounter (Signed)
Did she start the Vumerity?

## 2019-08-16 NOTE — Telephone Encounter (Signed)
I can't make that change to her FMLA

## 2019-08-16 NOTE — Telephone Encounter (Signed)
Yes per note scanned  in the chart,  pt medication was shipped 06/29/19.   Will call pt in regards to her FMLA.

## 2019-08-16 NOTE — Telephone Encounter (Signed)
Tried calling pt, No answer. VM FUll

## 2019-09-06 ENCOUNTER — Other Ambulatory Visit: Payer: Self-pay

## 2019-09-06 ENCOUNTER — Other Ambulatory Visit: Payer: Self-pay | Admitting: Neurology

## 2019-09-06 MED ORDER — "SAFETY SYRINGES/NEEDLE 21G X 1-1/2"" 5 ML MISC": 1.0000 mL | 0 refills | Status: AC

## 2019-09-06 NOTE — Telephone Encounter (Signed)
Yes, please send order.

## 2019-09-06 NOTE — Telephone Encounter (Signed)
Patient is supposed to give herself her last dose of Acthar, but they did not give her enough needles and syringes. She needs a prescription [facility] to [facility] on [address] for 2 needles and 1 syringe.

## 2019-09-06 NOTE — Telephone Encounter (Signed)
Orders added. Pt is aware

## 2019-09-06 NOTE — Telephone Encounter (Signed)
Pt needs 23G 5/8 needles and 23ml syringe sent to her pharmacy.  Per pt she spoke to [facility] and was advised to call us to see if that could be sent into her pharmacy since pt only needs it for tomorrow dose.

## 2019-11-01 NOTE — Progress Notes (Signed)
NEUROLOGY FOLLOW UP OFFICE NOTE  [name] [id number]  HISTORY OF PRESENT ILLNESS: Melissa Collins is a 27 year oldright-handed black female with hypertension who follows up for multiple sclerosis.  UPDATE: Current DMT:  Vumerity (been on for about a couple of months) Other medications:  D3 4000 IU   In March 2021, her tongue became numb and over the course of the next week gradually progressed to include the entire right side of her face and head with perioral tingling as well as tingling of her tongue and right side of her chin.  She cannot taste on the right side of her tongue.  No involvement of the neck, extremities or torso.  No weakness, dizziness, balance disorder or visual changes.  While she has had episodes of numbness in the past, nothing this severe.   It hasn't improved.  MRI of brain with and without contrast on 06/12/2019 showed new and active MS lesion in the right cerebellar peduncle, as well as new lesion in right centrum semiovale and a large lesion in the left periventricular white matter (grown since last imaging).  MRI of cervical spine with and without contrast showed no evidence of demyelinating disease.  Due to previous history suspicious for right amaurosis fugax, carotid doppler on 05/18/2019 was performed which no evidence of hemodynamically significant stenosis by duplex criteria but did demonstrate high resistance waveforms in the right ICA suggesting distal stenosis or intracranial stenosis.  She had a follow up CTA of head and neck on 06/06/2019 showed no significant intracranial or extracranial large vessel stenosis.  When last seen in March, she was prescribed H.P. Acthar Gel 80mg  daily for 2 weeks for acute flare up.  It was helpful.  It was decided to start Vumerity.  She feels woozy since starting the twice daily dosing.  She still feels off-balance.  She also reports  recurrence of intermittent right sided facial numbness (which resolved with the Acthar).     06/29/2019 LABS:  CBC with WBC 9.7, HGB 13.1, HCT 41.1, ALC 2.1; hepatic panel with t bili 0.5, ALP 71, AST 9, ALT 9; D 36  HISTORY: She was diagnosed with multiple sclerosis in 2005 after presenting with right hand and left foot numbness. On 09/21/2003, she had a CT head without contrast that showed two white matter lesions in the right frontal lobe. Follow up CT head with contrast demonstrated enhancement of one of the lesions. She had an MRI of the brain and cervical spine with and without contrast on 09/24/03 which showed multiple subcortical white matter lesions in the cerebral hemispheres ranging from few millimeters up to 2 cm in diameter, several with abnormal enhancement. No involvement of the brainstem, cerebellum and cervical spinal cord. She did not have a lumbar puncture. She was started on Betaseron from 2005 until early 2009. It was discontinued due to fatigue and flu-like symptoms. She has not been on any subsequent DMT. She reports no significant MS flares. Every now and then, she may feel lightheaded or she may note tingling in the leg or foot.  In 2019 to early 2020, she had 3 episodes where she woke up in the morning and had complete vision loss for a couple of minutes in the right eye. It happened three times about 3 to 4 months apart. She didn't follow up with the eye doctor. No ocular pain.   Prior disease modifying therapy:Betaseron (2005-2009; flu-like symptoms, fatigue)  Imaging: 09/21/03 CT with and without contrast: 2 white matter lesions in  right frontal lobe, one measuring 1 cm with enhancement. 09/24/03 MRIof brain and cervical spine with and without contrast: Multiple subcortical white matter lesions in the cerebral hemispheres ranging from few millimeters up to 2 cm in diameter, several with abnormal enhancement. No involvement of the brainstem, cerebellum and cervical spinal cord. 03/23/2004 MRI of brain and cervical spine with and without contrast: 2 or 3  new lesions however previously noted lesions from 10/03/2003 have decreased in signal intensity and size plus less contrast enhancement. 06/17/04 MRI of brain with and without contrast:Multiple subcortical white matter lesions in the cerebral hemisphere, stable compared to prior imaging from 03/23/2004 as well as right frontal parietal developmental venous anomaly. 02/28/08 MRIof brainand cervical spinewith and without contrast: Multiple subcortical white matter lesions in the bilateral cerebral hemispheres, improved from prior study on 06/17/2004. 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 No spinal cord involvement.  No family history of MS.  PAST MEDICAL HISTORY: Past Medical History:  Diagnosis Date  . Diabetes (HCC)   . High cholesterol   . Hypertension     MEDICATIONS: Current Outpatient Medications on File Prior to Visit  Medication Sig Dispense Refill  . amLODipine (NORVASC) 5 MG tablet Take by mouth.    Marland Kitchen atorvastatin (LIPITOR) 20 MG tablet TAKE 1 TABLET BY MOUTH EVERY DAY    . Cholecalciferol (D3-1000) 25 MCG (1000 UT) capsule Take 4 capsules (4,000 Units total) by mouth daily. 120 capsule 5  . corticotropin (ACTHAR) 80 UNIT/ML injectable gel Inject 1 mL (80 Units total) into the muscle daily. For 14 days. 1120 mL 0  . empagliflozin (JARDIANCE) 10 MG TABS tablet TAKE ONE TABLET (10 MG DOSE) BY MOUTH DAILY.    Marland Kitchen losartan-hydrochlorothiazide (HYZAAR) 100-25 MG tablet Take 1 tablet by mouth daily.    Letta Pate VERIO test strip 1 each daily.     No current facility-administered medications on file prior to visit.    ALLERGIES: Allergies  Allergen Reactions  . Metformin And Related Other (See Comments)    Fatigue     FAMILY HISTORY: Family History  Problem Relation Age of Onset  . Diabetes Mother   . Hypertension Mother   . Diabetes Father   . Healthy Brother     SOCIAL HISTORY: Social History   Socioeconomic History  . Marital  status: Single    Spouse name: Not on file  . Number of children: 1  . Years of education: Not on file  . Highest education level: Some college, no degree  Occupational History  . Not on file  Tobacco Use  . Smoking status: Current Some Day Smoker  . Smokeless tobacco: Never Used  Vaping Use  . Vaping Use: Never used  Substance and Sexual Activity  . Alcohol use: Yes  . Drug use: Never  . Sexual activity: Not on file  Other Topics Concern  . Not on file  Social History Narrative   Right handed   One story home   Drinks no caffeine   Social Determinants of Health   Financial Resource Strain:   . Difficulty of Paying Living Expenses: Not on file  Food Insecurity:   . Worried About Programme researcher, broadcasting/film/video in the Last Year: Not on file  . Ran Out of Food in the Last Year: Not on file  Transportation Needs:   . Lack of Transportation (Medical): Not on file  . Lack of Transportation (Non-Medical): Not on file  Physical Activity:   . Days of  Exercise per Week: Not on file  . Minutes of Exercise per Session: Not on file  Stress:   . Feeling of Stress : Not on file  Social Connections:   . Frequency of Communication with Friends and Family: Not on file  . Frequency of Social Gatherings with Friends and Family: Not on file  . Attends Religious Services: Not on file  . Active Member of Clubs or Organizations: Not on file  . Attends Banker Meetings: Not on file  . Marital Status: Not on file  Intimate Partner Violence:   . Fear of Current or Ex-Partner: Not on file  . Emotionally Abused: Not on file  . Physically Abused: Not on file  . Sexually Abused: Not on file    PHYSICAL EXAM: Blood pressure 132/84, pulse 70, height 5\' 4"  (1.626 m), weight 264 lb 9.6 oz (120 kg), SpO2 99 %. General: No acute distress.  Patient appears well-groomed.   Head:  Normocephalic/atraumatic Eyes:  Fundi examined but not visualized Neck: supple, no paraspinal tenderness, full range  of motion Heart:  Regular rate and rhythm Lungs:  Clear to auscultation bilaterally Back: No paraspinal tenderness Neurological Exam: alert and oriented to person, place, and time. Attention span and concentration intact, recent and remote memory intact, fund of knowledge intact.  Speech fluent and not dysarthric, language intact.  CN II-XII intact. Bulk and tone normal, muscle strength 5/5 throughout.  Sensation to pinprick and temperature and vibration intact.  Deep tendon reflexes 2+ throughout, toes downgoing.  Finger to nose and heel to shin testing intact.  Gait normal, Romberg negative.  IMPRESSION: Relapsing remitting multiple sclerosis  PLAN: 1.  Vumerity 2.  D3 4000 IU daily 3.  Check CBC with diff, CMP and vitamin D this week and again in 6 months. 4.  Repeat MRI of brain and cervical spine with and without contrast in 6 months 5.  Follow up in 6 months (after repeat testing)  , DO  CC: Shon Millet, PA-C

## 2019-11-05 ENCOUNTER — Encounter: Payer: Self-pay | Admitting: Neurology

## 2019-11-05 ENCOUNTER — Ambulatory Visit: Payer: 59 | Admitting: Neurology

## 2019-11-05 ENCOUNTER — Other Ambulatory Visit: Payer: Self-pay

## 2019-11-05 VITALS — BP 132/84 | HR 70 | Ht 64.0 in | Wt 264.6 lb

## 2019-11-05 DIAGNOSIS — G35 Multiple sclerosis: Secondary | ICD-10-CM | POA: Diagnosis not present

## 2019-11-05 NOTE — Patient Instructions (Signed)
1.  Continue Vumerity and D3 4000 IU daily 2.  Check CBC with diff, CMP and vitamin D level tomorrow and again in 6 months. 3.  Repeat MRI of brain and cervical spine with and without contrast in 6 months 4.  Follow up in 6 months (after repeat testing)

## 2019-11-09 ENCOUNTER — Other Ambulatory Visit (INDEPENDENT_AMBULATORY_CARE_PROVIDER_SITE_OTHER): Payer: 59

## 2019-11-09 ENCOUNTER — Other Ambulatory Visit: Payer: Self-pay

## 2019-11-09 DIAGNOSIS — G35 Multiple sclerosis: Secondary | ICD-10-CM

## 2019-11-09 LAB — COMPREHENSIVE METABOLIC PANEL
ALT: 9 U/L (ref 0–35)
AST: 9 U/L (ref 0–37)
Albumin: 4 g/dL (ref 3.5–5.2)
Alkaline Phosphatase: 63 U/L (ref 39–117)
BUN: 15 mg/dL (ref 6–23)
CO2: 28 mEq/L (ref 19–32)
Calcium: 9.4 mg/dL (ref 8.4–10.5)
Chloride: 98 mEq/L (ref 96–112)
Creatinine, Ser: 0.97 mg/dL (ref 0.40–1.20)
GFR: 76.22 mL/min (ref >60.00)
Glucose, Bld: 207 mg/dL — ABNORMAL HIGH (ref 70–99)
Potassium: 3.8 mEq/L (ref 3.5–5.1)
Sodium: 134 mEq/L — ABNORMAL LOW (ref 135–145)
Total Bilirubin: 0.4 mg/dL (ref 0.2–1.2)
Total Protein: 7.6 g/dL (ref 6.0–8.3)

## 2019-11-09 LAB — CBC WITH DIFFERENTIAL/PLATELET
Basophils Absolute: 0 10*3/uL (ref 0.0–0.1)
Basophils Relative: 0.7 % (ref 0.0–3.0)
Eosinophils Absolute: 0.3 10*3/uL (ref 0.0–0.7)
Eosinophils Relative: 3.9 % (ref 0.0–5.0)
HCT: 39.1 % (ref 36.0–46.0)
Hemoglobin: 12.7 g/dL (ref 12.0–15.0)
Lymphocytes Relative: 23.6 % (ref 12.0–46.0)
Lymphs Abs: 1.7 10*3/uL (ref 0.7–4.0)
MCHC: 32.4 g/dL (ref 30.0–36.0)
MCV: 82.3 fl (ref 78.0–100.0)
Monocytes Absolute: 0.8 10*3/uL (ref 0.1–1.0)
Monocytes Relative: 11.1 % (ref 3.0–12.0)
Neutro Abs: 4.5 10*3/uL (ref 1.4–7.7)
Neutrophils Relative %: 60.7 % (ref 43.0–77.0)
Platelets: 403 10*3/uL — ABNORMAL HIGH (ref 150.0–400.0)
RBC: 4.75 Mil/uL (ref 3.87–5.11)
RDW: 15.5 % (ref 11.5–15.5)
WBC: 7.4 10*3/uL (ref 4.0–10.5)

## 2019-11-09 LAB — VITAMIN D 25 HYDROXY (VIT D DEFICIENCY, FRACTURES): VITD: 34.3 ng/mL (ref 30.00–100.00)

## 2019-11-19 ENCOUNTER — Other Ambulatory Visit: Payer: Self-pay | Admitting: Neurology

## 2019-12-10 ENCOUNTER — Ambulatory Visit: Payer: 59 | Admitting: Neurology

## 2020-01-14 ENCOUNTER — Other Ambulatory Visit: Payer: Self-pay | Admitting: Neurology

## 2020-01-14 ENCOUNTER — Telehealth: Payer: Self-pay | Admitting: Neurology

## 2020-01-14 MED ORDER — VUMERITY 231 MG PO CPDR: 462.0000 mg | DELAYED_RELEASE_CAPSULE | Freq: Two times a day (BID) | ORAL | 5 refills | Status: DC

## 2020-01-14 NOTE — Telephone Encounter (Signed)
Done

## 2020-01-14 NOTE — Progress Notes (Unsigned)
vumerit

## 2020-03-31 ENCOUNTER — Other Ambulatory Visit: Payer: Self-pay

## 2020-03-31 ENCOUNTER — Telehealth: Payer: Self-pay | Admitting: Neurology

## 2020-03-31 MED ORDER — VUMERITY 231 MG PO CPDR: 462.0000 mg | DELAYED_RELEASE_CAPSULE | Freq: Two times a day (BID) | ORAL | 0 refills | Status: AC

## 2020-04-12 ENCOUNTER — Ambulatory Visit
Admission: RE | Admit: 2020-04-12 | Discharge: 2020-04-12 | Disposition: A | Discharge status: Home / Self Care | Payer: BLUE CROSS/BLUE SHIELD | Source: Ambulatory Visit | Attending: Neurology | Admitting: Neurology

## 2020-04-12 ENCOUNTER — Other Ambulatory Visit: Payer: Self-pay

## 2020-04-12 DIAGNOSIS — G35 Multiple sclerosis: Secondary | ICD-10-CM

## 2020-04-12 MED ORDER — GADOBENATE DIMEGLUMINE 529 MG/ML IV SOLN
20.0000 mL | Freq: Once | INTRAVENOUS | Status: AC | PRN
  Administered 2020-04-12: 20 mL via INTRAVENOUS

## 2020-05-13 NOTE — Progress Notes (Deleted)
NEUROLOGY FOLLOW UP OFFICE NOTE  Melissa Collins [id number]  Assessment/Plan:   Multiple sclerosis  1.  DMT:  Vumerity 2.  D3 4000 IU daily 3.  Check CBC with diff, CMP and vitamin D level today and repeat in 6 months. 4.  ***  Subjective:  Melissa Collins is a 64 year oldright-handed black female with hypertension who follows up for multiple sclerosis.  MRI of brain from last month personally reviewed.  UPDATE: Current DMT:  Vumerity (been on for about a couple of months) Other medications:  D3 4000 IU  04/12/2020 MRI BRAIN W WO:  No new or progressive lesions when compared to the study 06/12/2019.  Numerous foci of demyelination scattered throughout the brain are the same in number and size. No lesions show restricted diffusion or contrast enhancement. Previous enhancing lesion in the right middle cerebellar peduncle no longer enhances.  Vision:  *** Motor:  *** Sensory:  *** Pain:  *** Gait:  *** Bowel/Bladder:  *** Fatigue:  *** Cognition:  *** Mood:  ***   HISTORY: She was diagnosed with multiple sclerosis in 2005 after presenting with right hand and left foot numbness. On 09/21/2003, she had a CT head without contrast that showed two white matter lesions in the right frontal lobe. Follow up CT head with contrast demonstrated enhancement of one of the lesions. She had an MRI of the brain and cervical spine with and without contrast on 09/24/03 which showed multiple subcortical white matter lesions in the cerebral hemispheres ranging from few millimeters up to 2 cm in diameter, several with abnormal enhancement. No involvement of the brainstem, cerebellum and cervical spinal cord. She did not have a lumbar puncture. She was started on Betaseron from 2005 until early 2009. It was discontinued due to fatigue and flu-like symptoms. She has not been on any subsequent DMT. She reports no significant MS flares. Every now and then, she may feel lightheaded or she may note tingling  in the leg or foot.  In 2019 to early 2020, she had 3 episodes where she woke up in the morning and had complete vision loss for a couple of minutes in the right eye. It happened three times about 3 to 4 months apart. She didn't follow up with the eye doctor. No ocular pain.  Due to this previous history suspicious for right amaurosis fugax, carotid doppler on 05/18/2019 was performed which no evidence of hemodynamically significant stenosis by duplex criteria but did demonstrate high resistance waveforms in the right ICA suggesting distal stenosis or intracranial stenosis.  She had a follow up CTA of head and neck on 06/06/2019 showed no significant intracranial or extracranial large vessel stenosis.   In March 2021, her tongue became numb and over the course of the next week gradually progressed to include the entire right side of her face and head with perioral tingling as well as tingling of her tongue and right side of her chin. She cannot taste on the right side of her tongue. No involvement of the neck, extremities or torso. No weakness, dizziness, balance disorder or visual changes. While she has had episodes of numbness in the past, nothing this severe. MRI of brain showed active MS.  She was prescribed H.P. Acthar Gel 80mg  daily for 2 weeks for acute flare up which was helpful  Prior disease modifying therapy:Betaseron (2005-2009; flu-like symptoms, fatigue)  Imaging: 09/21/2003 CT HEAD W/WO: 2 white matter lesions in right frontal lobe, one measuring 1 cm with enhancement. 09/24/2003 MRIBRAIN &  CERVICAL SPINE W/WO: Multiple subcortical white matter lesions in the cerebral hemispheres ranging from few millimeters up to 2 cm in diameter, several with abnormal enhancement. No involvement of the brainstem, cerebellum and cervical spinal cord. 03/23/2004 MRI BRAIN & CERVICAL SPINE W/WO: 2 or 3 new lesions however previously noted lesions from 10/03/2003 have decreased in signal intensity  and size plus less contrast enhancement. 06/17/2004 MRI BRAIN W/WO:Multiple subcortical white matter lesions in the cerebral hemisphere, stable compared to prior imaging from 03/23/2004 as well as right frontal parietal developmental venous anomaly. 02/28/2008 MRIBRAIN & CERVICAL SPINE W/WO: Multiple subcortical white matter lesions in the bilateral cerebral hemispheres, improved from prior study on 06/17/2004. 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 No spinal cord involvement. 06/12/2019 MRI BRAIN W/WO: new and active MS lesion in the right cerebellar peduncle, as well as new lesion in right centrum semiovale and a large lesion in the left periventricular white matter (grown since last imaging).   06/12/2019 MRI CERVICAL SPINE W/WO:  no evidence of demyelinating disease.   No family history of MS.  PAST MEDICAL HISTORY: Past Medical History:  Diagnosis Date  . Diabetes (HCC)   . High cholesterol   . Hypertension     MEDICATIONS: Current Outpatient Medications on File Prior to Visit  Medication Sig Dispense Refill  . amLODipine (NORVASC) 5 MG tablet Take by mouth.    Marland Kitchen atorvastatin (LIPITOR) 20 MG tablet TAKE 1 TABLET BY MOUTH EVERY DAY    . corticotropin (ACTHAR) 80 UNIT/ML injectable gel Inject 1 mL (80 Units total) into the muscle daily. For 14 days. (Patient not taking: Reported on 11/05/2019) 1120 mL 0  . [facility] D3 25 MCG (1000 UT) capsule TAKE 4 CAPSULES (4,000 UNITS TOTAL) BY MOUTH DAILY. 120 capsule 5  . Diroximel Fumarate (VUMERITY) 231 MG CPDR Take 462 mg by mouth 2 (two) times daily. 120 capsule 0  . empagliflozin (JARDIANCE) 10 MG TABS tablet TAKE ONE TABLET (10 MG DOSE) BY MOUTH DAILY.    Marland Kitchen losartan-hydrochlorothiazide (HYZAAR) 100-25 MG tablet Take 1 tablet by mouth daily.    Letta Pate VERIO test strip 1 each daily.     No current facility-administered medications on file prior to visit.    ALLERGIES: Allergies  Allergen Reactions  .  Metformin And Related Other (See Comments)    Fatigue     FAMILY HISTORY: Family History  Problem Relation Age of Onset  . Diabetes Mother   . Hypertension Mother   . Diabetes Father   . Healthy Brother       Objective:  *** General: No acute distress.  Patient appears ***-groomed.   Head:  Normocephalic/atraumatic Eyes:  Fundi examined but not visualized Neck: supple, no paraspinal tenderness, full range of motion Heart:  Regular rate and rhythm Lungs:  Clear to auscultation bilaterally Back: No paraspinal tenderness Neurological Exam: alert and oriented to person, place, and time. Attention span and concentration intact, recent and remote memory intact, fund of knowledge intact.  Speech fluent and not dysarthric, language intact.  CN II-XII intact. Bulk and tone normal, muscle strength 5/5 throughout.  Sensation to light touch, temperature and vibration intact.  Deep tendon reflexes 2+ throughout, toes downgoing.  Finger to nose and heel to shin testing intact.  Gait normal, Romberg negative.     Shon Millet, DO  CC: Ralene Ok, PA-C

## 2020-05-14 ENCOUNTER — Telehealth: Payer: Self-pay

## 2020-05-14 ENCOUNTER — Ambulatory Visit: Payer: 59 | Admitting: Neurology

## 2020-05-14 NOTE — Telephone Encounter (Signed)
Tried calling pt, pt needs to have her 6 month labs done before Dr.Jaffe can send in a refill for her medication.

## 2020-08-21 NOTE — Progress Notes (Unsigned)
Prior authorization denied for vumerity. [facility] sent to scan. Advised patient NTBS per [facility] for Dr.Jaffe.

## 2020-11-06 NOTE — Progress Notes (Addendum)
NEUROLOGY FOLLOW UP OFFICE NOTE  Melissa Collins [id number]  Assessment/Plan:   Relapsing-remitting multiple sclerosis  DMT:  Vumerity - due not take with high-fat/high calorie (greater than 900 cal) meal D3 4000 IU Check CBC with diff, CMP, and vit D today and again in 6 months. Repeat MRI of brain with and without contrast in 6 months. Follow up 6 months.  Subjective:  Melissa Collins is a 45 year old right-handed black female with hypertension who follows up for multiple sclerosis.  MRI brain from February personally reviewed.   UPDATE: Current DMT:  Vumerity (since summer 2021) Not taking vitamin D3 because she was told that it is OTC.     04/12/2020 MRI brain with and without contrast:  No new or progressive lesions when compared to the study 06/12/2019.  Numerous foci of demyelination scattered throughout the brain are the same in number and size. No lesions show restricted diffusion or contrast enhancement. Previous enhancing lesion in the right middle cerebellar peduncle no longer enhances.  Still has residual numbness on the right side of the tongue but taste is now intact.   Last 2 digits right greater than left, sometimes toes off and on 24-48 hours   HISTORY: She was diagnosed with multiple sclerosis in 2005 after presenting with right hand and left foot numbness.  On 09/21/2003, she had a CT head without contrast that showed two white matter lesions in the right frontal lobe.  Follow up CT head with contrast demonstrated enhancement of one of the lesions.  She had an MRI of the brain and cervical spine with and without contrast on 09/24/03 which showed multiple subcortical white matter lesions in the cerebral hemispheres ranging from few millimeters up to 2 cm in diameter, several with abnormal enhancement.  No involvement of the brainstem, cerebellum and cervical spinal cord.  She did not have a lumbar puncture.  She was started on Betaseron from 2005 until early 2009.  It was  discontinued due to fatigue and flu-like symptoms.  She has not been on any subsequent DMT.  She reports no significant MS flares.  Every now and then, she may feel lightheaded or she may note tingling in the leg or foot.   In 2019 to early 2020, she had 3 episodes where she woke up in the morning and had complete vision loss for a couple of minutes in the right eye.  It happened three times about 3 to 4 months apart.  She didn't follow up with the eye doctor. No ocular pain.   carotid doppler on 05/18/2019 was performed which no evidence of hemodynamically significant stenosis by duplex criteria but did demonstrate high resistance waveforms in the right ICA suggesting distal stenosis or intracranial stenosis.  She had a follow up CTA of head and neck on 06/06/2019 showed no significant intracranial or extracranial large vessel stenosis.  In March 2021, she had an MS flare, described as right facial/cranial numbness and tingling, perioral tingling, tingling of tongue and loss of taste on right side of tongue.  She was treated with steroids but symptoms didn't improve.  MRI of brain with and without contrast on 06/12/2019 showed new and active MS lesion in the right cerebellar peduncle, as well as new lesion in right centrum semiovale and a large lesion in the left periventricular white matter (grown since last imaging).  She was then treated with H.P. Acthar Gel.     Prior disease modifying therapy:  Betaseron (2005-2009; flu-like symptoms, fatigue)  Imaging: 06/12/2019 MRI brain and cervical spine with and without contrast:  new and active MS lesion in the right cerebellar peduncle, as well as new lesion in right centrum semiovale and a large lesion in the left periventricular white matter (grown since last imaging.  Cervical spine with and without contrast showed no evidence of demyelinating disease 02/28/2008 MRI of brain and cervical spine with and without contrast:  Multiple subcortical white matter  lesions in the bilateral cerebral hemispheres, improved from prior study on 06/17/2004.  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  No spinal cord involvement. 06/17/2004 MRI of brain with and without contrast: Multiple subcortical white matter lesions in the cerebral hemisphere, stable compared to prior imaging from 03/23/2004 as well as right frontal parietal developmental venous anomaly. 03/23/2004 MRI of brain and cervical spine with and without contrast: 2 or 3 new lesions however previously noted lesions from 10/03/2003 have decreased in signal intensity and size plus less contrast enhancement. 09/24/2003 MRI of brain and cervical spine with and without contrast: Multiple subcortical white matter lesions in the cerebral hemispheres ranging from few millimeters up to 2 cm in diameter, several with abnormal enhancement.  No involvement of the brainstem, cerebellum and cervical spinal cord. 09/21/2003 CT with and without contrast:  2 white matter lesions in right frontal lobe, one measuring 1 cm with enhancement.  No family history of MS.  PAST MEDICAL HISTORY: Past Medical History:  Diagnosis Date   Diabetes (HCC)    High cholesterol    Hypertension     MEDICATIONS: Current Outpatient Medications on File Prior to Visit  Medication Sig Dispense Refill   amLODipine (NORVASC) 5 MG tablet Take by mouth.     atorvastatin (LIPITOR) 20 MG tablet TAKE 1 TABLET BY MOUTH EVERY DAY     corticotropin (ACTHAR) 80 UNIT/ML injectable gel Inject 1 mL (80 Units total) into the muscle daily. For 14 days. (Patient not taking: Reported on 11/05/2019) 1120 mL 0   [facility] D3 25 MCG (1000 UT) capsule TAKE 4 CAPSULES (4,000 UNITS TOTAL) BY MOUTH DAILY. 120 capsule 5   Diroximel Fumarate (VUMERITY) 231 MG CPDR Take 462 mg by mouth 2 (two) times daily. 120 capsule 0   empagliflozin (JARDIANCE) 10 MG TABS tablet TAKE ONE TABLET (10 MG DOSE) BY MOUTH DAILY.     losartan-hydrochlorothiazide  (HYZAAR) 100-25 MG tablet Take 1 tablet by mouth daily.     ONETOUCH VERIO test strip 1 each daily.     No current facility-administered medications on file prior to visit.    ALLERGIES: Allergies  Allergen Reactions   Metformin And Related Other (See Comments)    Fatigue     FAMILY HISTORY: Family History  Problem Relation Age of Onset   Diabetes Mother    Hypertension Mother    Diabetes Father    Healthy Brother       Objective:  Blood pressure 122/80, pulse 78, height 5\' 10"  (1.778 m), weight 264 lb (119.7 kg), SpO2 98 %. General: No acute distress.  Patient appears well-groomed.   Head:  Normocephalic/atraumatic Eyes:  Fundi examined but not visualized Neck: supple, no paraspinal tenderness, full range of motion Heart:  Regular rate and rhythm Lungs:  Clear to auscultation bilaterally Back: No paraspinal tenderness Neurological Exam: alert and oriented to person, place, and time.  Speech fluent and not dysarthric, language intact.  CN II-XII intact. Bulk and tone normal, muscle strength 5/5 throughout.  Sensation to light touch intact.  Deep tendon  reflexes 2+ throughout, toes downgoing.  Finger to nose testing intact.  Gait normal, Romberg negative.   Shon Millet, DO  CC: Ralene Ok, PA-C

## 2020-11-07 ENCOUNTER — Other Ambulatory Visit: Payer: BLUE CROSS/BLUE SHIELD

## 2020-11-07 ENCOUNTER — Ambulatory Visit: Payer: BLUE CROSS/BLUE SHIELD | Admitting: Neurology

## 2020-11-07 ENCOUNTER — Encounter: Payer: Self-pay | Admitting: Neurology

## 2020-11-07 ENCOUNTER — Other Ambulatory Visit: Payer: Self-pay

## 2020-11-07 VITALS — BP 122/80 | HR 78 | Ht 70.0 in | Wt 264.0 lb

## 2020-11-07 DIAGNOSIS — E119 Type 2 diabetes mellitus without complications: Secondary | ICD-10-CM

## 2020-11-07 DIAGNOSIS — G35 Multiple sclerosis: Secondary | ICD-10-CM | POA: Diagnosis not present

## 2020-11-07 LAB — CBC WITH DIFFERENTIAL/PLATELET
Basophils Absolute: 0.1 10*3/uL (ref 0.0–0.1)
Basophils Relative: 1.4 % (ref 0.0–3.0)
Eosinophils Absolute: 0.1 10*3/uL (ref 0.0–0.7)
Eosinophils Relative: 1.2 % (ref 0.0–5.0)
HCT: 37.2 % (ref 36.0–46.0)
Hemoglobin: 12.3 g/dL (ref 12.0–15.0)
Lymphocytes Relative: 26.4 % (ref 12.0–46.0)
Lymphs Abs: 1.8 10*3/uL (ref 0.7–4.0)
MCHC: 33 g/dL (ref 30.0–36.0)
MCV: 80.6 fl (ref 78.0–100.0)
Monocytes Absolute: 0.6 10*3/uL (ref 0.1–1.0)
Monocytes Relative: 9 % (ref 3.0–12.0)
Neutro Abs: 4.2 10*3/uL (ref 1.4–7.7)
Neutrophils Relative %: 62 % (ref 43.0–77.0)
Platelets: 391 10*3/uL (ref 150.0–400.0)
RBC: 4.61 Mil/uL (ref 3.87–5.11)
RDW: 15.7 % — ABNORMAL HIGH (ref 11.5–15.5)
WBC: 6.7 10*3/uL (ref 4.0–10.5)

## 2020-11-07 LAB — COMPREHENSIVE METABOLIC PANEL
ALT: 8 U/L (ref 0–35)
AST: 8 U/L (ref 0–37)
Albumin: 3.9 g/dL (ref 3.5–5.2)
Alkaline Phosphatase: 73 U/L (ref 39–117)
BUN: 10 mg/dL (ref 6–23)
CO2: 31 mEq/L (ref 19–32)
Calcium: 9.4 mg/dL (ref 8.4–10.5)
Chloride: 97 mEq/L (ref 96–112)
Creatinine, Ser: 0.91 mg/dL (ref 0.40–1.20)
GFR: 77.56 mL/min (ref >60.00)
Glucose, Bld: 288 mg/dL — ABNORMAL HIGH (ref 70–99)
Potassium: 4.1 mEq/L (ref 3.5–5.1)
Sodium: 134 mEq/L — ABNORMAL LOW (ref 135–145)
Total Bilirubin: 0.4 mg/dL (ref 0.2–1.2)
Total Protein: 7.5 g/dL (ref 6.0–8.3)

## 2020-11-07 LAB — VITAMIN B12: Vitamin B-12: 430 pg/mL (ref 211–911)

## 2020-11-07 LAB — VITAMIN D 25 HYDROXY (VIT D DEFICIENCY, FRACTURES): VITD: 25.71 ng/mL — ABNORMAL LOW (ref 30.00–100.00)

## 2020-11-07 NOTE — Patient Instructions (Signed)
Continue Vumerity.  Do not take with high-fat/high calorie meal (greater than 900 calories) Check CBC with diff, CMP, Vit D and B12.  Repeat CBC with diff, CMP and Vit D again in 6 months Repeat MRI of brain with and without contrast in 6 months Follow up in 6 months (after repeat testing)

## 2020-11-18 NOTE — Progress Notes (Signed)
Pt advised of her labs results, and recommendation to increase her Vitamin d3. Pt wanted to know if she needed that over the counter of Script. The 4000 Iu's was a script.  Please advise

## 2021-05-18 NOTE — Progress Notes (Deleted)
? ?NEUROLOGY FOLLOW UP OFFICE NOTE ? ?Melissa Collins ?[id number] ? ?Assessment/Plan:  ? ?Relapsing-remitting multiple sclerosis ?  ?DMT:  Vumerity - due not take with high-fat/high calorie (greater than 900 cal) meal ?D3 4000 IU ?Check CBC with diff, CMP, and vit D today and again in 6 months. ?Repeat MRI of brain with and without contrast in 6 months. ?Follow up 6 months. ?  ?Subjective:  ?Melissa Collins is a 46 year old right-handed black female with hypertension who follows up for multiple sclerosis.  MRI brain from February personally reviewed. ?  ?UPDATE: ?Current DMT:  Vumerity (since summer 2021) ?Not taking vitamin D3 because she was told that it is OTC.   ? ?11/07/2020 LABS:  CBC with WBC 6.7, HGB 12.3, HCT 37.2, PLT 391, ALC 1.8; CMP with Na 134, K 4.1, Cl 97, CO2 31, glucose 288, GFR 77.56, BUN 10, Cr 0.91, t bili 0.4, ALP 73, AST 8, ALT 8; Vit D 25.71; B12 430 ? ?MRI of brain was ordered last visit but not performed.  *** ?  ?Vision:  *** ?Motor:  *** ?Sensory:  *** ?Pain:  *** ?Gait:  *** ?Bowel/Bladder:  *** ?Fatigue:  *** ?Cognition:  *** ?Mood:  *** ? ?  ?Still has residual numbness on the right side of the tongue but taste is now intact.  ?  ?Last 2 digits right greater than left, sometimes toes off and on 24-48 hours ?  ?HISTORY: ?She was diagnosed with multiple sclerosis in 2005 after presenting with right hand and left foot numbness.  On 09/21/2003, she had a CT head without contrast that showed two white matter lesions in the right frontal lobe.  Follow up CT head with contrast demonstrated enhancement of one of the lesions.  She had an MRI of the brain and cervical spine with and without contrast on 09/24/03 which showed multiple subcortical white matter lesions in the cerebral hemispheres ranging from few millimeters up to 2 cm in diameter, several with abnormal enhancement.  No involvement of the brainstem, cerebellum and cervical spinal cord.  She did not have a lumbar puncture.  She was started on  Betaseron from 2005 until early 2009.  It was discontinued due to fatigue and flu-like symptoms.  She has not been on any subsequent DMT.  She reports no significant MS flares.  Every now and then, she may feel lightheaded or she may note tingling in the leg or foot. ?  ?In 2019 to early 2020, she had 3 episodes where she woke up in the morning and had complete vision loss for a couple of minutes in the right eye.  It happened three times about 3 to 4 months apart.  She didn't follow up with the eye doctor. No ocular pain.   carotid doppler on 05/18/2019 was performed which no evidence of hemodynamically significant stenosis by duplex criteria but did demonstrate high resistance waveforms in the right ICA suggesting distal stenosis or intracranial stenosis.  She had a follow up CTA of head and neck on 06/06/2019 showed no significant intracranial or extracranial large vessel stenosis. ?  ?In March 2021, she had an MS flare, described as right facial/cranial numbness and tingling, perioral tingling, tingling of tongue and loss of taste on right side of tongue.  She was treated with steroids but symptoms didn't improve.  MRI of brain with and without contrast on 06/12/2019 showed new and active MS lesion in the right cerebellar peduncle, as well as new lesion in right centrum semiovale  and a large lesion in the left periventricular white matter (grown since last imaging).  She was then treated with H.P. Acthar Gel.   ?  ?Prior disease modifying therapy:  Betaseron (2005-2009; flu-like symptoms, fatigue) ?  ?Imaging: ?04/12/2020 MRI brain with and without contrast:  No new or progressive lesions when compared to the study 06/12/2019.  Numerous foci of demyelination scattered throughout the brain are the same in number and size. No lesions show restricted diffusion or contrast enhancement. Previous enhancing lesion in the right middle cerebellar peduncle no longer enhances. ?06/12/2019 MRI brain and cervical spine with and  without contrast:  new and active MS lesion in the right cerebellar peduncle, as well as new lesion in right centrum semiovale and a large lesion in the left periventricular white matter (grown since last imaging.  Cervical spine with and without contrast showed no evidence of demyelinating disease ?02/28/2008 MRI of brain and cervical spine with and without contrast:  Multiple subcortical white matter lesions in the bilateral cerebral hemispheres, improved from prior study on 06/17/2004.  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  No spinal cord involvement. ?06/17/2004 MRI of brain with and without contrast: Multiple subcortical white matter lesions in the cerebral hemisphere, stable compared to prior imaging from 03/23/2004 as well as right frontal parietal developmental venous anomaly. ?03/23/2004 MRI of brain and cervical spine with and without contrast: 2 or 3 new lesions however previously noted lesions from 10/03/2003 have decreased in signal intensity and size plus less contrast enhancement. ?09/24/2003 MRI of brain and cervical spine with and without contrast: Multiple subcortical white matter lesions in the cerebral hemispheres ranging from few millimeters up to 2 cm in diameter, several with abnormal enhancement.  No involvement of the brainstem, cerebellum and cervical spinal cord. ?09/21/2003 CT with and without contrast:  2 white matter lesions in right frontal lobe, one measuring 1 cm with enhancement. ?  ?No family history of MS. ? ?PAST MEDICAL HISTORY: ?Past Medical History:  ?Diagnosis Date  ? Diabetes (HCC)   ? High cholesterol   ? Hypertension   ? ? ?MEDICATIONS: ?Current Outpatient Medications on File Prior to Visit  ?Medication Sig Dispense Refill  ? amLODipine (NORVASC) 5 MG tablet Take by mouth.    ? atorvastatin (LIPITOR) 20 MG tablet TAKE 1 TABLET BY MOUTH EVERY DAY    ? corticotropin (ACTHAR) 80 UNIT/ML injectable gel Inject 1 mL (80 Units total) into the  muscle daily. For 14 days. (Patient not taking: No sig reported) 1120 mL 0  ? [facility] D3 25 MCG (1000 UT) capsule TAKE 4 CAPSULES (4,000 UNITS TOTAL) BY MOUTH DAILY. (Patient not taking: Reported on 11/07/2020) 120 capsule 5  ? Diroximel Fumarate (VUMERITY) 231 MG CPDR Take 462 mg by mouth 2 (two) times daily. 120 capsule 0  ? empagliflozin (JARDIANCE) 10 MG TABS tablet TAKE ONE TABLET (10 MG DOSE) BY MOUTH DAILY.    ? losartan-hydrochlorothiazide (HYZAAR) 100-25 MG tablet Take 1 tablet by mouth daily.    ? ONETOUCH VERIO test strip 1 each daily.    ? ?No current facility-administered medications on file prior to visit.  ? ? ?ALLERGIES: ?Allergies  ?Allergen Reactions  ? Metformin And Related Other (See Comments)  ?  Fatigue   ? ? ?FAMILY HISTORY: ?Family History  ?Problem Relation Age of Onset  ? Diabetes Mother   ? Hypertension Mother   ? Diabetes Father   ? Healthy Brother   ? ? ?  ?Objective:  ?*** ?  General: No acute distress.  Patient appears well-groomed.   ?Head:  Normocephalic/atraumatic ?Eyes:  Fundi examined but not visualized ?Neck: supple, no paraspinal tenderness, full range of motion ?Heart:  Regular rate and rhythm ?Lungs:  Clear to auscultation bilaterally ?Back: No paraspinal tenderness ?Neurological Exam: alert and oriented to person, place, and time.  Speech fluent and not dysarthric, language intact.  CN II-XII intact. Bulk and tone normal, muscle strength 5/5 throughout.  Sensation to light touch intact.  Deep tendon reflexes 2+ throughout, toes downgoing.  Finger to nose testing intact.  Gait normal, Romberg negative. ? ? ?Shon Millet, DO ? ?CC: Ralene Ok, PA-C ? ? ? ? ? ? ?

## 2021-05-19 ENCOUNTER — Ambulatory Visit: Payer: BLUE CROSS/BLUE SHIELD | Admitting: Neurology

## 2021-05-25 NOTE — Progress Notes (Signed)
? ?NEUROLOGY FOLLOW UP OFFICE NOTE ? ?Melissa Collins ?[id number] ? ?Assessment/Plan:  ? ?1  Relapsing-remitting multiple sclerosis ?2  Hypertension - elevated today.  Did not take her medications ?  ?DMT:  Vumerity - due not take with high-fat/high calorie (greater than 900 cal) meal ?For muscle cramps, baclofen 10mg  three times daily as needed.  Caution for drowsiness ?Check CBC with diff, CMP, and vit D today and again in 6 months. ?Check MRI of brain with and without contrast . ?Advised to take her BP medication.  If still elevated, contact PCP ?Follow up 6 months. ?  ?Subjective:  ?Melissa Collins is a 46 year old right-handed black female with hypertension who follows up for multiple sclerosis.  MRI brain from February personally reviewed. ?  ?UPDATE: ?Current DMT:  Vumerity (since summer 2021) ?  ?11/07/2021 LABS:  CBC with WBC 6.7, HGB 12.3, HCT 37.2, PLT 391, ALC 1.8; CMP with Na 134, K 4.1, Cl 97, CO2 31, glucose 288, BUN 10, Cr 0.91, t bili 0.4, ALP 73, AST 8, ALT 8; B12 430; vit D 25.71.  Advised to start D3  4000 IU daily.  Not taking it.   ?  ?Still has residual numbness on the right side of the tongue but taste is now intact.  Last 2 digits right greater than left, sometimes toes off and on 24-48 hours.  One day the left anterior thigh was numb for 2 days.  Notes muscle cramps in fingers or ankles over last 3-4 months.  Occurs once or twice every 2 weeks.  On atorvastatin for a long time.  ?  ?HISTORY: ?She was diagnosed with multiple sclerosis in 2005 after presenting with right hand and left foot numbness.  On 09/21/2003, she had a CT head without contrast that showed two white matter lesions in the right frontal lobe.  Follow up CT head with contrast demonstrated enhancement of one of the lesions.  She had an MRI of the brain and cervical spine with and without contrast on 09/24/03 which showed multiple subcortical white matter lesions in the cerebral hemispheres ranging from few millimeters up to 2 cm in  diameter, several with abnormal enhancement.  No involvement of the brainstem, cerebellum and cervical spinal cord.  She did not have a lumbar puncture.  She was started on Betaseron from 2005 until early 2009.  It was discontinued due to fatigue and flu-like symptoms.  She has not been on any subsequent DMT.  She reports no significant MS flares.  Every now and then, she may feel lightheaded or she may note tingling in the leg or foot. ?  ?In 2019 to early 2020, she had 3 episodes where she woke up in the morning and had complete vision loss for a couple of minutes in the right eye.  It happened three times about 3 to 4 months apart.  She didn't follow up with the eye doctor. No ocular pain.   carotid doppler on 05/18/2019 was performed which no evidence of hemodynamically significant stenosis by duplex criteria but did demonstrate high resistance waveforms in the right ICA suggesting distal stenosis or intracranial stenosis.  She had a follow up CTA of head and neck on 06/06/2019 showed no significant intracranial or extracranial large vessel stenosis. ?  ?In March 2021, she had an MS flare, described as right facial/cranial numbness and tingling, perioral tingling, tingling of tongue and loss of taste on right side of tongue.  She was treated with steroids but symptoms didn't improve.  MRI of brain with and without contrast on 06/12/2019 showed new and active MS lesion in the right cerebellar peduncle, as well as new lesion in right centrum semiovale and a large lesion in the left periventricular white matter (grown since last imaging).  She was then treated with H.P. Acthar Gel.   ?  ?Prior disease modifying therapy:  Betaseron (2005-2009; flu-like symptoms, fatigue) ?  ?Imaging: ?04/12/2020 MRI brain with and without contrast:  No new or progressive lesions when compared to the study 06/12/2019.  Numerous foci of demyelination scattered throughout the brain are the same in number and size. No lesions show  restricted diffusion or contrast enhancement. Previous enhancing lesion in the right middle cerebellar peduncle no longer enhances. ?06/12/2019 MRI brain and cervical spine with and without contrast:  new and active MS lesion in the right cerebellar peduncle, as well as new lesion in right centrum semiovale and a large lesion in the left periventricular white matter (grown since last imaging.  Cervical spine with and without contrast showed no evidence of demyelinating disease ?02/28/2008 MRI of brain and cervical spine with and without contrast:  Multiple subcortical white matter lesions in the bilateral cerebral hemispheres, improved from prior study on 06/17/2004.  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  No spinal cord involvement. ?06/17/2004 MRI of brain with and without contrast: Multiple subcortical white matter lesions in the cerebral hemisphere, stable compared to prior imaging from 03/23/2004 as well as right frontal parietal developmental venous anomaly. ?03/23/2004 MRI of brain and cervical spine with and without contrast: 2 or 3 new lesions however previously noted lesions from 10/03/2003 have decreased in signal intensity and size plus less contrast enhancement. ?09/24/2003 MRI of brain and cervical spine with and without contrast: Multiple subcortical white matter lesions in the cerebral hemispheres ranging from few millimeters up to 2 cm in diameter, several with abnormal enhancement.  No involvement of the brainstem, cerebellum and cervical spinal cord. ?09/21/2003 CT with and without contrast:  2 white matter lesions in right frontal lobe, one measuring 1 cm with enhancement. ?  ?No family history of MS. ? ?PAST MEDICAL HISTORY: ?Past Medical History:  ?Diagnosis Date  ? Diabetes (HCC)   ? High cholesterol   ? Hypertension   ? ? ?MEDICATIONS: ?Current Outpatient Medications on File Prior to Visit  ?Medication Sig Dispense Refill  ? amLODipine (NORVASC) 5 MG tablet  Take by mouth.    ? atorvastatin (LIPITOR) 20 MG tablet TAKE 1 TABLET BY MOUTH EVERY DAY    ? corticotropin (ACTHAR) 80 UNIT/ML injectable gel Inject 1 mL (80 Units total) into the muscle daily. For 14 days. (Patient not taking: No sig reported) 1120 mL 0  ? [facility]S D3 25 MCG (1000 UT) capsule TAKE 4 CAPSULES (4,000 UNITS TOTAL) BY MOUTH DAILY. (Patient not taking: Reported on 11/07/2020) 120 capsule 5  ? Diroximel Fumarate (VUMERITY) 231 MG CPDR Take 462 mg by mouth 2 (two) times daily. 120 capsule 0  ? empagliflozin (JARDIANCE) 10 MG TABS tablet TAKE ONE TABLET (10 MG DOSE) BY MOUTH DAILY.    ? losartan-hydrochlorothiazide (HYZAAR) 100-25 MG tablet Take 1 tablet by mouth daily.    ? ONETOUCH VERIO test strip 1 each daily.    ? ?No current facility-administered medications on file prior to visit.  ? ? ?ALLERGIES: ?Allergies  ?Allergen Reactions  ? Metformin And Related Other (See Comments)  ?  Fatigue   ? ? ?FAMILY HISTORY: ?Family History  ?Problem Relation Age of  Onset  ? Diabetes Mother   ? Hypertension Mother   ? Diabetes Father   ? Healthy Brother   ? ? ?  ?Objective:  ?Blood pressure (!) 164/115, pulse 78, height 5\' 4"  (1.626 m), weight 265 lb 12.8 oz (120.6 kg), SpO2 98 %. ?General: No acute distress.  Patient appears well-groomed.   ?Head:  Normocephalic/atraumatic ?Eyes:  Fundi examined but not visualized ?Neck: supple, no paraspinal tenderness, full range of motion ?Heart:  Regular rate and rhythm ?Lungs:  Clear to auscultation bilaterally ?Back: No paraspinal tenderness ?Neurological Exam: alert and oriented to person, place, and time.  Speech fluent and not dysarthric, language intact.  CN II-XII intact. Bulk and tone normal, muscle strength 5/5 throughout.  Sensation to light touch and vibration intact.  Deep tendon reflexes 2+ throughout, toes downgoing.  Finger to nose testing intact.  Gait normal, Romberg negative. ? ? ? , DO ? ?CC: Melissa Millet, PA-C ? ? ? ? ? ? ?

## 2021-05-26 ENCOUNTER — Encounter: Payer: Self-pay | Admitting: Neurology

## 2021-05-26 ENCOUNTER — Other Ambulatory Visit (INDEPENDENT_AMBULATORY_CARE_PROVIDER_SITE_OTHER): Payer: BLUE CROSS/BLUE SHIELD

## 2021-05-26 ENCOUNTER — Ambulatory Visit: Payer: BLUE CROSS/BLUE SHIELD | Admitting: Neurology

## 2021-05-26 VITALS — BP 164/115 | HR 78 | Ht 64.0 in | Wt 265.8 lb

## 2021-05-26 DIAGNOSIS — G35 Multiple sclerosis: Secondary | ICD-10-CM | POA: Diagnosis not present

## 2021-05-26 DIAGNOSIS — I10 Essential (primary) hypertension: Secondary | ICD-10-CM | POA: Diagnosis not present

## 2021-05-26 LAB — COMPREHENSIVE METABOLIC PANEL
ALT: 10 U/L (ref 0–35)
AST: 9 U/L (ref 0–37)
Albumin: 3.8 g/dL (ref 3.5–5.2)
Alkaline Phosphatase: 75 U/L (ref 39–117)
BUN: 11 mg/dL (ref 6–23)
CO2: 28 mEq/L (ref 19–32)
Calcium: 9.2 mg/dL (ref 8.4–10.5)
Chloride: 96 mEq/L (ref 96–112)
Creatinine, Ser: 0.94 mg/dL (ref 0.40–1.20)
GFR: 74.32 mL/min (ref >60.00)
Glucose, Bld: 348 mg/dL — ABNORMAL HIGH (ref 70–99)
Potassium: 4 mEq/L (ref 3.5–5.1)
Sodium: 131 mEq/L — ABNORMAL LOW (ref 135–145)
Total Bilirubin: 0.5 mg/dL (ref 0.2–1.2)
Total Protein: 6.9 g/dL (ref 6.0–8.3)

## 2021-05-26 LAB — VITAMIN D 25 HYDROXY (VIT D DEFICIENCY, FRACTURES): VITD: 22.48 ng/mL — ABNORMAL LOW (ref 30.00–100.00)

## 2021-05-26 MED ORDER — BACLOFEN 10 MG PO TABS: 10.0000 mg | ORAL_TABLET | Freq: Three times a day (TID) | ORAL | 5 refills | Status: AC | PRN

## 2021-05-26 NOTE — Patient Instructions (Signed)
Continue Vumerity ?For muscle cramps, take baclofen 10mg  three times daily as needed ?Check CBC with diff, CMP, vit D today and again in 6 months ?Check MRI of brain with and without contrast ?Take your blood pressure medications.  If remains elevated, contact your PCP ?Follow up 6 months. ?

## 2021-05-27 LAB — CBC WITH DIFFERENTIAL
Basophils Absolute: 0 10*3/uL (ref 0.0–0.2)
Basos: 1 %
EOS (ABSOLUTE): 0.2 10*3/uL (ref 0.0–0.4)
Eos: 3 %
Hematocrit: 38.9 % (ref 34.0–46.6)
Hemoglobin: 12.5 g/dL (ref 11.1–15.9)
Immature Grans (Abs): 0 10*3/uL (ref 0.0–0.1)
Immature Granulocytes: 0 %
Lymphocytes Absolute: 1.7 10*3/uL (ref 0.7–3.1)
Lymphs: 24 %
MCH: 26.9 pg (ref 26.6–33.0)
MCHC: 32.1 g/dL (ref 31.5–35.7)
MCV: 84 fL (ref 79–97)
Monocytes Absolute: 0.6 10*3/uL (ref 0.1–0.9)
Monocytes: 9 %
Neutrophils Absolute: 4.6 10*3/uL (ref 1.4–7.0)
Neutrophils: 63 %
RBC: 4.64 x10E6/uL (ref 3.77–5.28)
RDW: 14.5 % (ref 11.7–15.4)
WBC: 7.2 10*3/uL (ref 3.4–10.8)

## 2021-05-28 NOTE — Progress Notes (Signed)
Patient advised of her labs results.

## 2021-11-25 NOTE — Progress Notes (Deleted)
NEUROLOGY FOLLOW UP OFFICE NOTE  Melissa Collins [id number]  Assessment/Plan:   Relapsing-remitting multiple sclerosis    DMT:  Vumerity - due not take with high-fat/high calorie (greater than 900 cal) meal For muscle cramps, baclofen 10mg  three times daily as needed.  Caution for drowsiness Check CBC with diff, CMP, and vit D today and again in 6 months. Check MRI of brain with and without contrast . Advised to take her BP medication.  If still elevated, contact PCP Follow up 6 months.   Subjective:  Melissa Collins is a 46 year old right-handed black female with hypertension who follows up for multiple sclerosis.   UPDATE: Current DMT:  Vumerity (since summer 2021)   MRI was ordered but not performed.   Still has residual numbness on the right side of the tongue but taste is now intact.  Last 2 digits right greater than left, sometimes toes off and on 24-48 hours.  One day the left anterior thigh was numb for 2 days.  Notes muscle cramps in fingers or ankles over last 3-4 months.  Occurs once or twice every 2 weeks.  On atorvastatin for a long time.    HISTORY: She was diagnosed with multiple sclerosis in 2005 after presenting with right hand and left foot numbness.  On 09/21/2003, she had a CT head without contrast that showed two white matter lesions in the right frontal lobe.  Follow up CT head with contrast demonstrated enhancement of one of the lesions.  She had an MRI of the brain and cervical spine with and without contrast on 09/24/03 which showed multiple subcortical white matter lesions in the cerebral hemispheres ranging from few millimeters up to 2 cm in diameter, several with abnormal enhancement.  No involvement of the brainstem, cerebellum and cervical spinal cord.  She did not have a lumbar puncture.  She was started on Betaseron from 2005 until early 2009.  It was discontinued due to fatigue and flu-like symptoms.  She has not been on any subsequent DMT.  She reports no significant  MS flares.  Every now and then, she may feel lightheaded or she may note tingling in the leg or foot.   In 2019 to early 2020, she had 3 episodes where she woke up in the morning and had complete vision loss for a couple of minutes in the right eye.  It happened three times about 3 to 4 months apart.  She didn't follow up with the eye doctor. No ocular pain.   carotid doppler on 05/18/2019 was performed which no evidence of hemodynamically significant stenosis by duplex criteria but did demonstrate high resistance waveforms in the right ICA suggesting distal stenosis or intracranial stenosis.  She had a follow up CTA of head and neck on 06/06/2019 showed no significant intracranial or extracranial large vessel stenosis.   In March 2021, she had an MS flare, described as right facial/cranial numbness and tingling, perioral tingling, tingling of tongue and loss of taste on right side of tongue.  She was treated with steroids but symptoms didn't improve.  MRI of brain with and without contrast on 06/12/2019 showed new and active MS lesion in the right cerebellar peduncle, as well as new lesion in right centrum semiovale and a large lesion in the left periventricular white matter (grown since last imaging).  She was then treated with H.P. Acthar Gel.     Prior disease modifying therapy:  Betaseron (2005-2009; flu-like symptoms, fatigue)   Imaging: 04/12/2020 MRI brain with  and without contrast:  No new or progressive lesions when compared to the study 06/12/2019.  Numerous foci of demyelination scattered throughout the brain are the same in number and size. No lesions show restricted diffusion or contrast enhancement. Previous enhancing lesion in the right middle cerebellar peduncle no longer enhances. 06/12/2019 MRI brain and cervical spine with and without contrast:  new and active MS lesion in the right cerebellar peduncle, as well as new lesion in right centrum semiovale and a large lesion in the left  periventricular white matter (grown since last imaging.  Cervical spine with and without contrast showed no evidence of demyelinating disease 02/28/2008 MRI of brain and cervical spine with and without contrast:  Multiple subcortical white matter lesions in the bilateral cerebral hemispheres, improved from prior study on 06/17/2004.  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  No spinal cord involvement. 06/17/2004 MRI of brain with and without contrast: Multiple subcortical white matter lesions in the cerebral hemisphere, stable compared to prior imaging from 03/23/2004 as well as right frontal parietal developmental venous anomaly. 03/23/2004 MRI of brain and cervical spine with and without contrast: 2 or 3 new lesions however previously noted lesions from 10/03/2003 have decreased in signal intensity and size plus less contrast enhancement. 09/24/2003 MRI of brain and cervical spine with and without contrast: Multiple subcortical white matter lesions in the cerebral hemispheres ranging from few millimeters up to 2 cm in diameter, several with abnormal enhancement.  No involvement of the brainstem, cerebellum and cervical spinal cord. 09/21/2003 CT with and without contrast:  2 white matter lesions in right frontal lobe, one measuring 1 cm with enhancement.   No family history of MS.  PAST MEDICAL HISTORY: Past Medical History:  Diagnosis Date   Diabetes ([place])    High cholesterol    Hypertension     MEDICATIONS: Current Outpatient Medications on File Prior to Visit  Medication Sig Dispense Refill   amLODipine (NORVASC) 5 MG tablet Take by mouth.     atorvastatin (LIPITOR) 20 MG tablet TAKE 1 TABLET BY MOUTH EVERY DAY     baclofen (LIORESAL) 10 MG tablet Take 1 tablet (10 mg total) by mouth 3 (three) times daily as needed for muscle spasms. 90 each 5   corticotropin (ACTHAR) 80 UNIT/ML injectable gel Inject 1 mL (80 Units total) into the muscle daily. For 14 days.  (Patient not taking: No sig reported) 1120 mL 0   CVS D3 25 MCG (1000 UT) capsule TAKE 4 CAPSULES (4,000 UNITS TOTAL) BY MOUTH DAILY. (Patient not taking: Reported on 11/07/2020) 120 capsule 5   Diroximel Fumarate (VUMERITY) 231 MG CPDR Take 462 mg by mouth 2 (two) times daily. (Patient not taking: Reported on 05/26/2021) 120 capsule 0   empagliflozin (JARDIANCE) 10 MG TABS tablet TAKE ONE TABLET (10 MG DOSE) BY MOUTH DAILY.     losartan-hydrochlorothiazide (HYZAAR) 100-25 MG tablet Take 1 tablet by mouth daily.     ONETOUCH VERIO test strip 1 each daily.     No current facility-administered medications on file prior to visit.    ALLERGIES: Allergies  Allergen Reactions   Metformin And Related Other (See Comments)    Fatigue     FAMILY HISTORY: Family History  Problem Relation Age of Onset   Diabetes Mother    Hypertension Mother    Diabetes Father    Healthy Brother       Objective:  *** General: No acute distress.  Patient appears well-groomed.   Head:  Normocephalic/atraumatic Eyes:  Fundi examined but not visualized Neck: supple, no paraspinal tenderness, full range of motion Heart:  Regular rate and rhythm Neurological Exam: ***   Metta Clines, DO  CC: Junie Spencer, PA-C

## 2021-11-29 IMAGING — US US CAROTID DUPLEX BILAT
1 series · 13 of 24 positions shown · non-contrast
Comparison: None.

CLINICAL DATA: 43-year-old female with a history of amaurosis fugax

EXAM:
BILATERAL CAROTID DUPLEX ULTRASOUND
TECHNIQUE: Gray scale imaging, color Doppler and duplex ultrasound were
performed of bilateral carotid and vertebral arteries in the neck.

[Series 1: us carotid duplex bilat · 0.06mm/px · 13 of 68 slices shown]
[im 1/68]
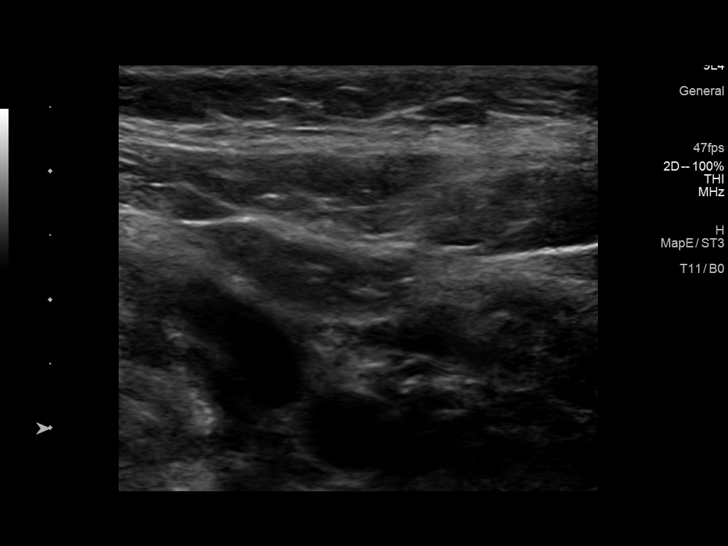
[im 6/68]
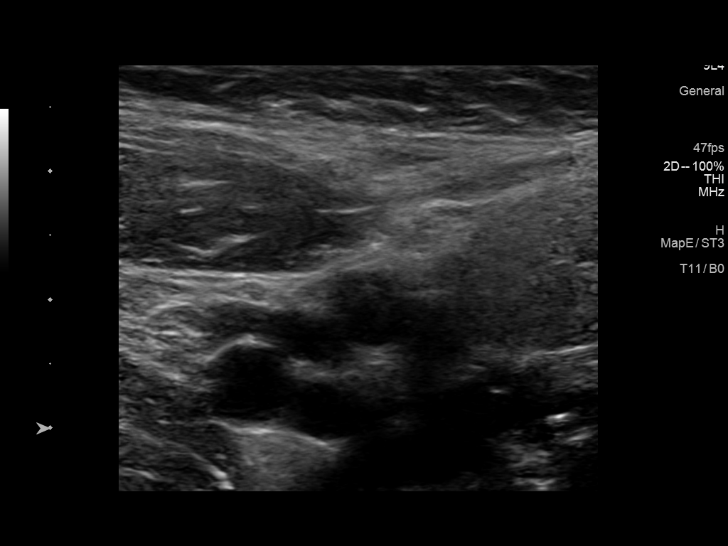
[im 12/68]
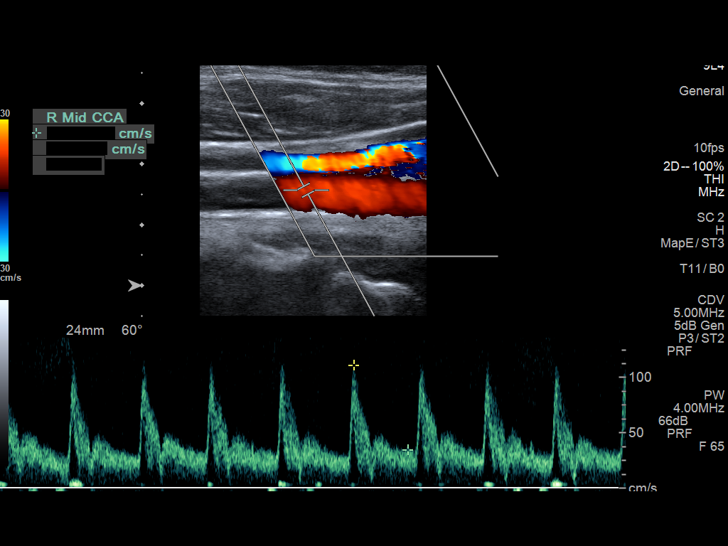
[im 18/68]
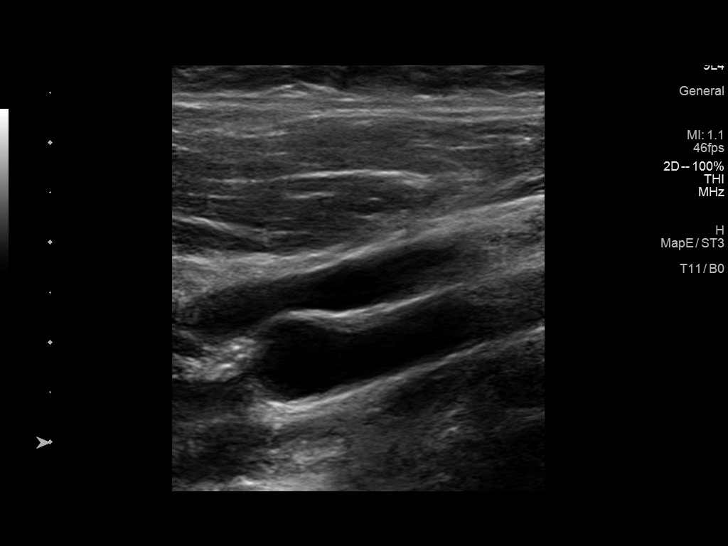
[im 24/68]
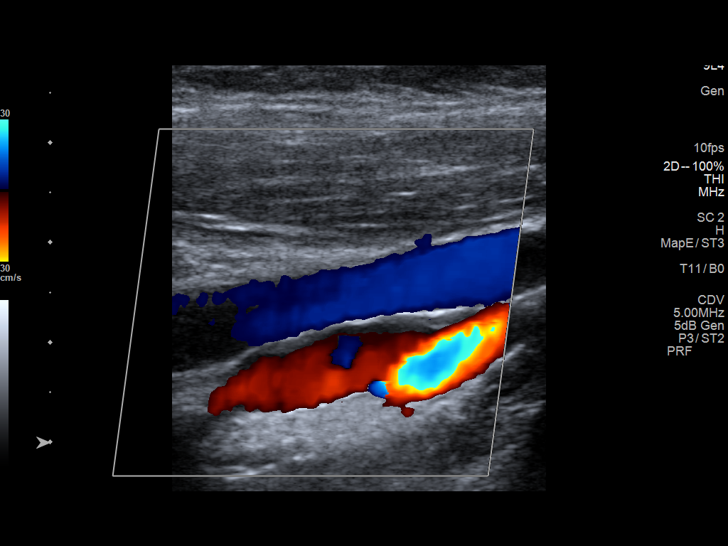
[im 30/68]
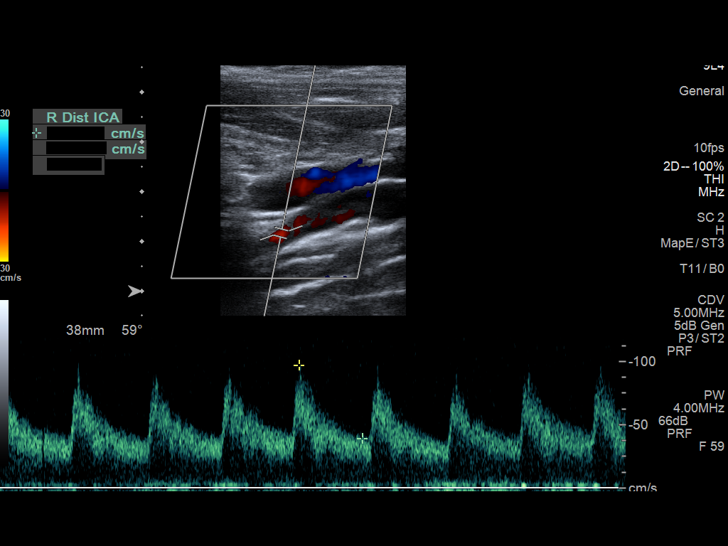
[im 35/68]
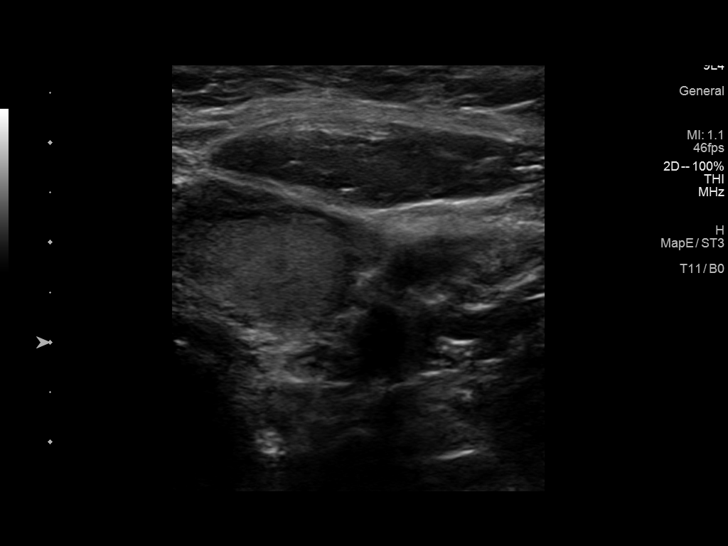
[im 38/68]
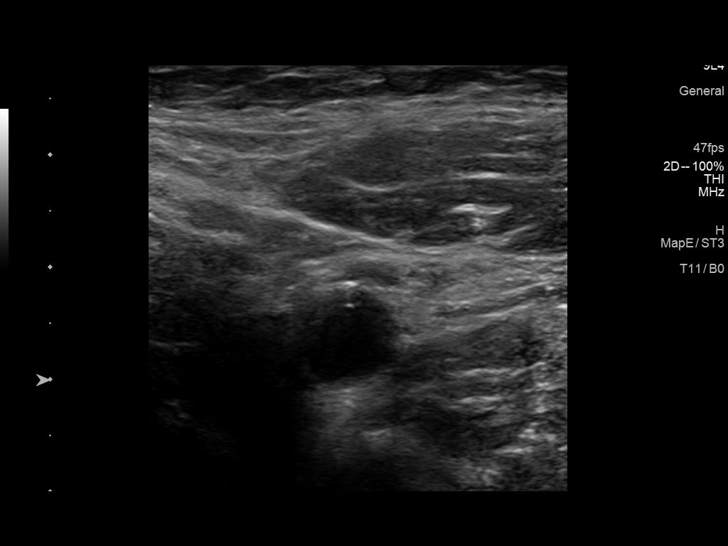
[im 44/68]
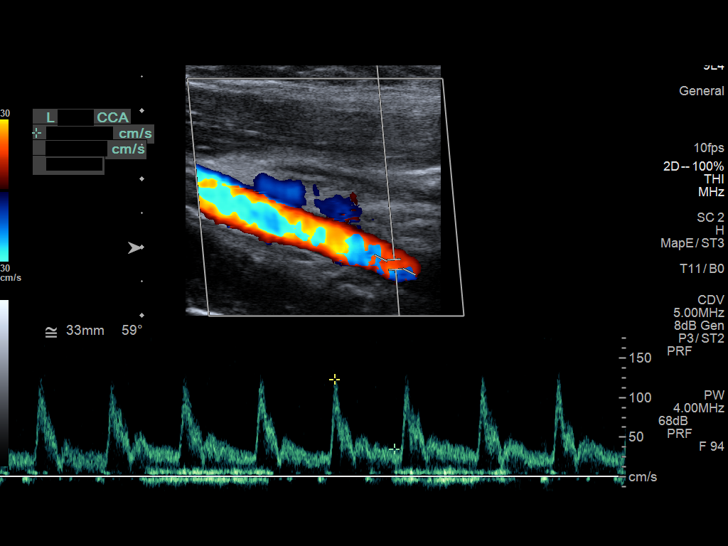
[im 50/68]
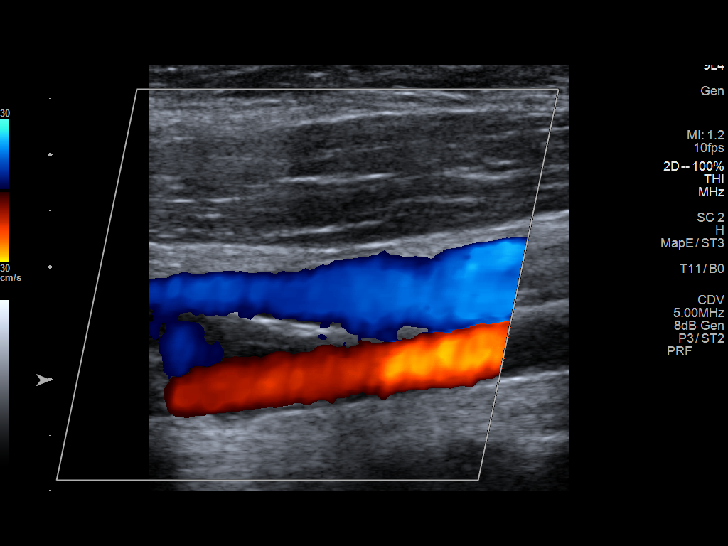
[im 56/68]
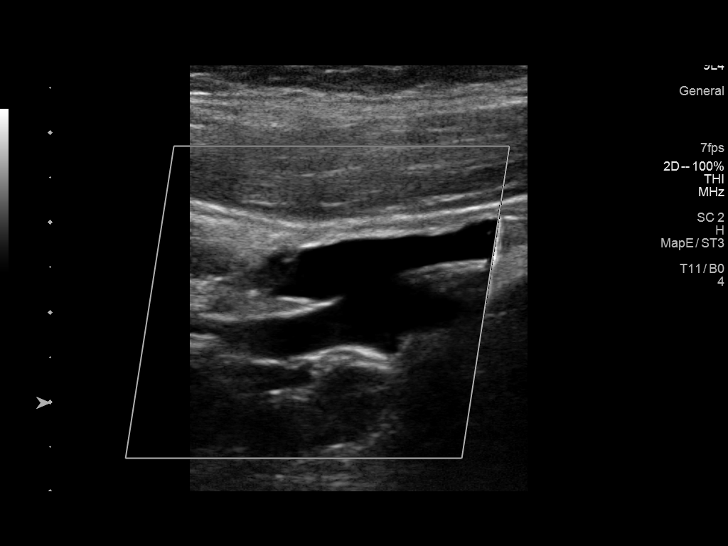
[im 62/68]
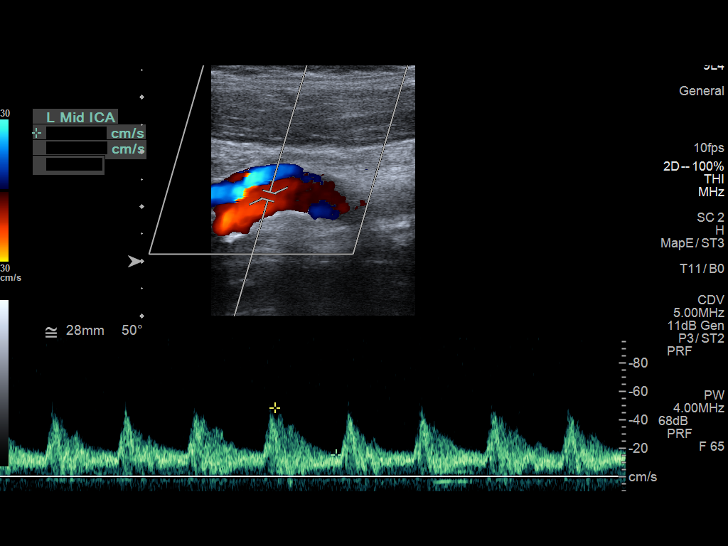
[im 68/68]
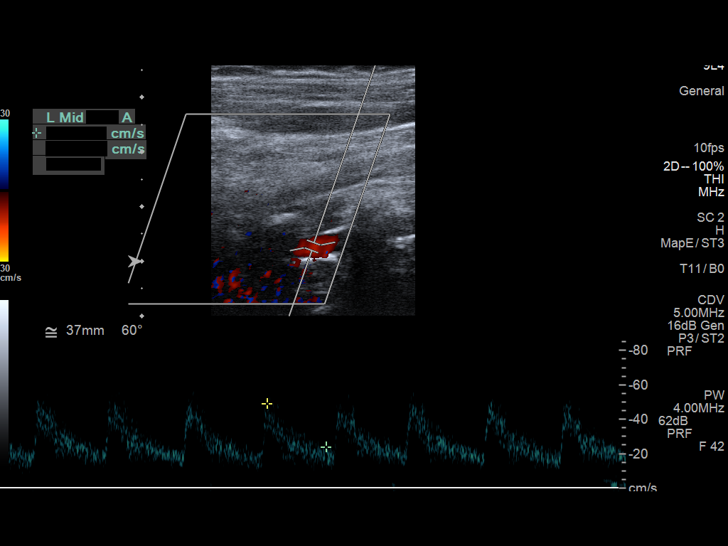

[13 of 24 positions shown; findings below may reference images not displayed]

FINDINGS: Criteria: Quantification of carotid stenosis is based on velocity
parameters that correlate the residual internal carotid diameter
with NASCET-based stenosis levels, using the diameter of the distal
internal carotid lumen as the denominator for stenosis measurement.

The following velocity measurements were obtained:

RIGHT

ICA:  Systolic 99 cm/sec, Diastolic 42 cm/sec

CCA:  122 cm/sec

SYSTOLIC ICA/CCA RATIO:

ECA:  83 cm/sec

LEFT

ICA:  Systolic 94 cm/sec, Diastolic 28 cm/sec

CCA:  132 cm/sec

SYSTOLIC ICA/CCA RATIO:

ECA:  89 cm/sec

Right Brachial SBP: Not acquired

Left Brachial SBP: Not acquired

RIGHT CAROTID ARTERY: No significant calcifications of the right
common carotid artery. Intermediate waveform maintained.
Heterogeneous and partially calcified plaque at the right carotid
bifurcation. No significant lumen shadowing. High resistance
waveform of the right ICA with antegrade flow in diastole.

RIGHT VERTEBRAL ARTERY: Antegrade flow with low resistance waveform.

LEFT CAROTID ARTERY: No significant calcifications of the left
common carotid artery. Intermediate waveform maintained.
Heterogeneous and partially calcified plaque at the left carotid
bifurcation without significant lumen shadowing. Low resistance
waveform of the left ICA. No significant tortuosity.

LEFT VERTEBRAL ARTERY:  Antegrade flow with low resistance waveform.
IMPRESSION: Right:

No hemodynamically significant stenosis by duplex criteria in the
extracranial cerebrovascular circulation, however, there is high
resistance waveform developing in the right internal carotid artery
suggesting distal stenosis, potential intracranial stenosis. If
further diagnostic imaging is warranted, consider formal angiogram,
or alternatively CT angiogram.

Left:

Color duplex indicates minimal heterogeneous and calcified plaque,
with no hemodynamically significant stenosis by duplex criteria in
the extracranial cerebrovascular circulation.

## 2021-11-30 ENCOUNTER — Ambulatory Visit: Payer: BLUE CROSS/BLUE SHIELD | Admitting: Neurology

## 2021-12-08 NOTE — Progress Notes (Deleted)
 NEUROLOGY FOLLOW UP OFFICE NOTE  Melissa Collins [id number]  Assessment/Plan:   Relapsing-remitting multiple sclerosis    DMT:  Vumerity - due not take with high-fat/high calorie (greater than 900 cal) meal For muscle cramps, baclofen 10mg three times daily as needed.  Caution for drowsiness Check CBC with diff, CMP, and vit D today and again in 6 months. Check MRI of brain with and without contrast . Advised to take her BP medication.  If still elevated, contact PCP Follow up 6 months.   Subjective:  Melissa Collins is a 46 year old right-handed black female with hypertension who follows up for multiple sclerosis.   UPDATE: Current DMT:  Vumerity (since summer 2021)   MRI was ordered but not performed.   Still has residual numbness on the right side of the tongue but taste is now intact.  Last 2 digits right greater than left, sometimes toes off and on 24-48 hours.  One day the left anterior thigh was numb for 2 days.  Notes muscle cramps in fingers or ankles over last 3-4 months.  Occurs once or twice every 2 weeks.  On atorvastatin for a long time.    HISTORY: She was diagnosed with multiple sclerosis in 2005 after presenting with right hand and left foot numbness.  On 09/21/2003, she had a CT head without contrast that showed two white matter lesions in the right frontal lobe.  Follow up CT head with contrast demonstrated enhancement of one of the lesions.  She had an MRI of the brain and cervical spine with and without contrast on 09/24/03 which showed multiple subcortical white matter lesions in the cerebral hemispheres ranging from few millimeters up to 2 cm in diameter, several with abnormal enhancement.  No involvement of the brainstem, cerebellum and cervical spinal cord.  She did not have a lumbar puncture.  She was started on Betaseron from 2005 until early 2009.  It was discontinued due to fatigue and flu-like symptoms.  She has not been on any subsequent DMT.  She reports no significant  MS flares.  Every now and then, she may feel lightheaded or she may note tingling in the leg or foot.   In 2019 to early 2020, she had 3 episodes where she woke up in the morning and had complete vision loss for a couple of minutes in the right eye.  It happened three times about 3 to 4 months apart.  She didn't follow up with the eye doctor. No ocular pain.   carotid doppler on 05/18/2019 was performed which no evidence of hemodynamically significant stenosis by duplex criteria but did demonstrate high resistance waveforms in the right ICA suggesting distal stenosis or intracranial stenosis.  She had a follow up CTA of head and neck on 06/06/2019 showed no significant intracranial or extracranial large vessel stenosis.   In March 2021, she had an MS flare, described as right facial/cranial numbness and tingling, perioral tingling, tingling of tongue and loss of taste on right side of tongue.  She was treated with steroids but symptoms didn't improve.  MRI of brain with and without contrast on 06/12/2019 showed new and active MS lesion in the right cerebellar peduncle, as well as new lesion in right centrum semiovale and a large lesion in the left periventricular white matter (grown since last imaging).  She was then treated with H.P. Acthar Gel.     Prior disease modifying therapy:  Betaseron (2005-2009; flu-like symptoms, fatigue)   Imaging: 04/12/2020 MRI brain with   and without contrast:  No new or progressive lesions when compared to the study 06/12/2019.  Numerous foci of demyelination scattered throughout the brain are the same in number and size. No lesions show restricted diffusion or contrast enhancement. Previous enhancing lesion in the right middle cerebellar peduncle no longer enhances. 06/12/2019 MRI brain and cervical spine with and without contrast:  new and active MS lesion in the right cerebellar peduncle, as well as new lesion in right centrum semiovale and a large lesion in the left  periventricular white matter (grown since last imaging.  Cervical spine with and without contrast showed no evidence of demyelinating disease 02/28/2008 MRI of brain and cervical spine with and without contrast:  Multiple subcortical white matter lesions in the bilateral cerebral hemispheres, improved from prior study on 06/17/2004.  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  No spinal cord involvement. 06/17/2004 MRI of brain with and without contrast: Multiple subcortical white matter lesions in the cerebral hemisphere, stable compared to prior imaging from 03/23/2004 as well as right frontal parietal developmental venous anomaly. 03/23/2004 MRI of brain and cervical spine with and without contrast: 2 or 3 new lesions however previously noted lesions from 10/03/2003 have decreased in signal intensity and size plus less contrast enhancement. 09/24/2003 MRI of brain and cervical spine with and without contrast: Multiple subcortical white matter lesions in the cerebral hemispheres ranging from few millimeters up to 2 cm in diameter, several with abnormal enhancement.  No involvement of the brainstem, cerebellum and cervical spinal cord. 09/21/2003 CT with and without contrast:  2 white matter lesions in right frontal lobe, one measuring 1 cm with enhancement.   No family history of MS.  PAST MEDICAL HISTORY: Past Medical History:  Diagnosis Date   Diabetes ([place])    High cholesterol    Hypertension     MEDICATIONS: Current Outpatient Medications on File Prior to Visit  Medication Sig Dispense Refill   amLODipine (NORVASC) 5 MG tablet Take by mouth.     atorvastatin (LIPITOR) 20 MG tablet TAKE 1 TABLET BY MOUTH EVERY DAY     baclofen (LIORESAL) 10 MG tablet Take 1 tablet (10 mg total) by mouth 3 (three) times daily as needed for muscle spasms. 90 each 5   corticotropin (ACTHAR) 80 UNIT/ML injectable gel Inject 1 mL (80 Units total) into the muscle daily. For 14 days.  (Patient not taking: No sig reported) 1120 mL 0   CVS D3 25 MCG (1000 UT) capsule TAKE 4 CAPSULES (4,000 UNITS TOTAL) BY MOUTH DAILY. (Patient not taking: Reported on 11/07/2020) 120 capsule 5   Diroximel Fumarate (VUMERITY) 231 MG CPDR Take 462 mg by mouth 2 (two) times daily. (Patient not taking: Reported on 05/26/2021) 120 capsule 0   empagliflozin (JARDIANCE) 10 MG TABS tablet TAKE ONE TABLET (10 MG DOSE) BY MOUTH DAILY.     losartan-hydrochlorothiazide (HYZAAR) 100-25 MG tablet Take 1 tablet by mouth daily.     ONETOUCH VERIO test strip 1 each daily.     No current facility-administered medications on file prior to visit.    ALLERGIES: Allergies  Allergen Reactions   Metformin And Related Other (See Comments)    Fatigue     FAMILY HISTORY: Family History  Problem Relation Age of Onset   Diabetes Mother    Hypertension Mother    Diabetes Father    Healthy Brother       Objective:  *** General: No acute distress.  Patient appears well-groomed.   Head:  Normocephalic/atraumatic Eyes:  Fundi examined but not visualized Neck: supple, no paraspinal tenderness, full range of motion Heart:  Regular rate and rhythm Neurological Exam: ***   Jeriann Sayres, DO  CC: Sarah Gordon, PA-C      

## 2021-12-11 ENCOUNTER — Ambulatory Visit: Payer: BLUE CROSS/BLUE SHIELD | Admitting: Neurology

## 2021-12-21 NOTE — Progress Notes (Deleted)
 NEUROLOGY FOLLOW UP OFFICE NOTE  Vianka Pellegrin [id number]  Assessment/Plan:   Relapsing-remitting multiple sclerosis    DMT:  Vumerity - due not take with high-fat/high calorie (greater than 900 cal) meal For muscle cramps, baclofen 10mg three times daily as needed.  Caution for drowsiness Check CBC with diff, CMP, and vit D today and again in 6 months. Check MRI of brain with and without contrast . Advised to take her BP medication.  If still elevated, contact PCP Follow up 6 months.   Subjective:  Rhiannon Meulemans is a 46 year old right-handed black female with hypertension who follows up for multiple sclerosis.   UPDATE: Current DMT:  Vumerity (since summer 2021)   MRI was ordered but not performed.   Still has residual numbness on the right side of the tongue but taste is now intact.  Last 2 digits right greater than left, sometimes toes off and on 24-48 hours.  One day the left anterior thigh was numb for 2 days.  Notes muscle cramps in fingers or ankles over last 3-4 months.  Occurs once or twice every 2 weeks.  On atorvastatin for a long time.    HISTORY: She was diagnosed with multiple sclerosis in 2005 after presenting with right hand and left foot numbness.  On 09/21/2003, she had a CT head without contrast that showed two white matter lesions in the right frontal lobe.  Follow up CT head with contrast demonstrated enhancement of one of the lesions.  She had an MRI of the brain and cervical spine with and without contrast on 09/24/03 which showed multiple subcortical white matter lesions in the cerebral hemispheres ranging from few millimeters up to 2 cm in diameter, several with abnormal enhancement.  No involvement of the brainstem, cerebellum and cervical spinal cord.  She did not have a lumbar puncture.  She was started on Betaseron from 2005 until early 2009.  It was discontinued due to fatigue and flu-like symptoms.  She has not been on any subsequent DMT.  She reports no significant  MS flares.  Every now and then, she may feel lightheaded or she may note tingling in the leg or foot.   In 2019 to early 2020, she had 3 episodes where she woke up in the morning and had complete vision loss for a couple of minutes in the right eye.  It happened three times about 3 to 4 months apart.  She didn't follow up with the eye doctor. No ocular pain.   carotid doppler on 05/18/2019 was performed which no evidence of hemodynamically significant stenosis by duplex criteria but did demonstrate high resistance waveforms in the right ICA suggesting distal stenosis or intracranial stenosis.  She had a follow up CTA of head and neck on 06/06/2019 showed no significant intracranial or extracranial large vessel stenosis.   In March 2021, she had an MS flare, described as right facial/cranial numbness and tingling, perioral tingling, tingling of tongue and loss of taste on right side of tongue.  She was treated with steroids but symptoms didn't improve.  MRI of brain with and without contrast on 06/12/2019 showed new and active MS lesion in the right cerebellar peduncle, as well as new lesion in right centrum semiovale and a large lesion in the left periventricular white matter (grown since last imaging).  She was then treated with H.P. Acthar Gel.     Prior disease modifying therapy:  Betaseron (2005-2009; flu-like symptoms, fatigue)   Imaging: 04/12/2020 MRI brain with   and without contrast:  No new or progressive lesions when compared to the study 06/12/2019.  Numerous foci of demyelination scattered throughout the brain are the same in number and size. No lesions show restricted diffusion or contrast enhancement. Previous enhancing lesion in the right middle cerebellar peduncle no longer enhances. 06/12/2019 MRI brain and cervical spine with and without contrast:  new and active MS lesion in the right cerebellar peduncle, as well as new lesion in right centrum semiovale and a large lesion in the left  periventricular white matter (grown since last imaging.  Cervical spine with and without contrast showed no evidence of demyelinating disease 02/28/2008 MRI of brain and cervical spine with and without contrast:  Multiple subcortical white matter lesions in the bilateral cerebral hemispheres, improved from prior study on 06/17/2004.  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  No spinal cord involvement. 06/17/2004 MRI of brain with and without contrast: Multiple subcortical white matter lesions in the cerebral hemisphere, stable compared to prior imaging from 03/23/2004 as well as right frontal parietal developmental venous anomaly. 03/23/2004 MRI of brain and cervical spine with and without contrast: 2 or 3 new lesions however previously noted lesions from 10/03/2003 have decreased in signal intensity and size plus less contrast enhancement. 09/24/2003 MRI of brain and cervical spine with and without contrast: Multiple subcortical white matter lesions in the cerebral hemispheres ranging from few millimeters up to 2 cm in diameter, several with abnormal enhancement.  No involvement of the brainstem, cerebellum and cervical spinal cord. 09/21/2003 CT with and without contrast:  2 white matter lesions in right frontal lobe, one measuring 1 cm with enhancement.   No family history of MS.  PAST MEDICAL HISTORY: Past Medical History:  Diagnosis Date   Diabetes ([facility])    High cholesterol    Hypertension     MEDICATIONS: Current Outpatient Medications on File Prior to Visit  Medication Sig Dispense Refill   amLODipine (NORVASC) 5 MG tablet Take by mouth.     atorvastatin (LIPITOR) 20 MG tablet TAKE 1 TABLET BY MOUTH EVERY DAY     baclofen (LIORESAL) 10 MG tablet Take 1 tablet (10 mg total) by mouth 3 (three) times daily as needed for muscle spasms. 90 each 5   corticotropin (ACTHAR) 80 UNIT/ML injectable gel Inject 1 mL (80 Units total) into the muscle daily. For 14 days.  (Patient not taking: No sig reported) 1120 mL 0   CVS D3 25 MCG (1000 UT) capsule TAKE 4 CAPSULES (4,000 UNITS TOTAL) BY MOUTH DAILY. (Patient not taking: Reported on 11/07/2020) 120 capsule 5   Diroximel Fumarate (VUMERITY) 231 MG CPDR Take 462 mg by mouth 2 (two) times daily. (Patient not taking: Reported on 05/26/2021) 120 capsule 0   empagliflozin (JARDIANCE) 10 MG TABS tablet TAKE ONE TABLET (10 MG DOSE) BY MOUTH DAILY.     losartan-hydrochlorothiazide (HYZAAR) 100-25 MG tablet Take 1 tablet by mouth daily.     ONETOUCH VERIO test strip 1 each daily.     No current facility-administered medications on file prior to visit.    ALLERGIES: Allergies  Allergen Reactions   Metformin And Related Other (See Comments)    Fatigue     FAMILY HISTORY: Family History  Problem Relation Age of Onset   Diabetes Mother    Hypertension Mother    Diabetes Father    Healthy Brother       Objective:  *** General: No acute distress.  Patient appears well-groomed.   Head:    Normocephalic/atraumatic Eyes:  Fundi examined but not visualized Neck: supple, no paraspinal tenderness, full range of motion Heart:  Regular rate and rhythm Neurological Exam: ***   Teon Hudnall, DO  CC: Sarah Gordon, PA-C      

## 2021-12-22 ENCOUNTER — Ambulatory Visit: Payer: BLUE CROSS/BLUE SHIELD | Admitting: Neurology
